# Patient Record
Sex: Male | Born: 1940 | Race: White | Hispanic: No | Marital: Single
Health system: Southern US, Community
[De-identification: ages and names within clinical notes are randomized; demographics above are authoritative.]

---

## 2011-11-25 ENCOUNTER — Inpatient Hospital Stay: Payer: Self-pay | Admitting: Specialist

## 2011-11-25 DIAGNOSIS — I369 Nonrheumatic tricuspid valve disorder, unspecified: Secondary | ICD-10-CM

## 2011-11-25 LAB — BASIC METABOLIC PANEL
Anion Gap: 11 (ref 7–16)
BUN: 51 mg/dL — ABNORMAL HIGH (ref 7–18)
Calcium, Total: 8.5 mg/dL (ref 8.5–10.1)
Chloride: 95 mmol/L — ABNORMAL LOW (ref 98–107)
Glucose: 153 mg/dL — ABNORMAL HIGH (ref 65–99)
Glucose: 196 mg/dL — ABNORMAL HIGH (ref 65–99)
Osmolality: 295 (ref 275–301)
Potassium: 5.9 mmol/L — ABNORMAL HIGH (ref 3.5–5.1)
Sodium: 133 mmol/L — ABNORMAL LOW (ref 136–145)

## 2011-11-25 LAB — URINALYSIS, COMPLETE
Hyaline Cast: 3
Nitrite: NEGATIVE
Protein: NEGATIVE
Specific Gravity: 1.013 (ref 1.003–1.030)
WBC UR: 2 /HPF (ref 0–5)

## 2011-11-25 LAB — CBC
HGB: 14.8 g/dL (ref 13.0–18.0)
MCH: 31 pg (ref 26.0–34.0)
MCHC: 31.9 g/dL — ABNORMAL LOW (ref 32.0–36.0)
Platelet: 263 10*3/uL (ref 150–440)
RBC: 4.76 10*6/uL (ref 4.40–5.90)

## 2011-11-25 LAB — COMPREHENSIVE METABOLIC PANEL
Albumin: 3.2 g/dL — ABNORMAL LOW (ref 3.4–5.0)
BUN: 60 mg/dL — ABNORMAL HIGH (ref 7–18)
Glucose: 186 mg/dL — ABNORMAL HIGH (ref 65–99)
SGOT(AST): 25 U/L (ref 15–37)
SGPT (ALT): 18 U/L
Total Protein: 7 g/dL (ref 6.4–8.2)

## 2011-11-25 LAB — CK TOTAL AND CKMB (NOT AT ARMC)
CK, Total: 97 U/L (ref 35–232)
CK-MB: 9.9 ng/mL — ABNORMAL HIGH (ref 0.5–3.6)
CK-MB: 10.8 ng/mL — ABNORMAL HIGH (ref 0.5–3.6)

## 2011-11-25 LAB — RAPID INFLUENZA A&B ANTIGENS

## 2011-11-25 LAB — TROPONIN I: Troponin-I: 0.06 ng/mL — ABNORMAL HIGH

## 2011-11-25 LAB — SODIUM, URINE, RANDOM: Sodium, Urine Random: 72 mmol/L (ref 20–110)

## 2011-11-26 LAB — PROTEIN ELECTROPHORESIS(ARMC)

## 2011-11-26 LAB — HEMOGLOBIN A1C: Hemoglobin A1C: 7.5 % — ABNORMAL HIGH (ref 4.2–6.3)

## 2011-11-26 LAB — BASIC METABOLIC PANEL
BUN: 42 mg/dL — ABNORMAL HIGH (ref 7–18)
Calcium, Total: 8.2 mg/dL — ABNORMAL LOW (ref 8.5–10.1)
Creatinine: 1.14 mg/dL (ref 0.60–1.30)
EGFR (Non-African Amer.): >60
Glucose: 199 mg/dL — ABNORMAL HIGH (ref 65–99)
Potassium: 5.8 mmol/L — ABNORMAL HIGH (ref 3.5–5.1)
Sodium: 137 mmol/L (ref 136–145)

## 2011-11-26 LAB — CBC WITH DIFFERENTIAL/PLATELET
Basophil #: 0 10*3/uL (ref 0.0–0.1)
Lymphocyte #: 0.2 10*3/uL — ABNORMAL LOW (ref 1.0–3.6)
MCH: 30.9 pg (ref 26.0–34.0)
MCHC: 32.3 g/dL (ref 32.0–36.0)
MCV: 96 fL (ref 80–100)
Monocyte #: 0.1 10*3/uL (ref 0.0–0.7)
Platelet: 229 10*3/uL (ref 150–440)
RDW: 13.9 % (ref 11.5–14.5)

## 2011-11-26 LAB — LIPID PANEL: Ldl Cholesterol, Calc: 52 mg/dL (ref 0–100)

## 2011-11-26 LAB — MAGNESIUM: Magnesium: 1.3 mg/dL — ABNORMAL LOW

## 2011-11-26 LAB — TROPONIN I: Troponin-I: 0.09 ng/mL — ABNORMAL HIGH

## 2011-11-27 LAB — CBC WITH DIFFERENTIAL/PLATELET
Eosinophil #: 0 10*3/uL (ref 0.0–0.7)
Lymphocyte #: 0.1 10*3/uL — ABNORMAL LOW (ref 1.0–3.6)
Lymphocyte %: 1.4 %
MCHC: 32.6 g/dL (ref 32.0–36.0)
MCV: 95 fL (ref 80–100)
Monocyte %: 2.9 %
Neutrophil #: 9.9 10*3/uL — ABNORMAL HIGH (ref 1.4–6.5)
Platelet: 230 10*3/uL (ref 150–440)
RDW: 14.2 % (ref 11.5–14.5)
WBC: 10.3 10*3/uL (ref 3.8–10.6)

## 2011-11-27 LAB — BASIC METABOLIC PANEL
Anion Gap: 10 (ref 7–16)
EGFR (African American): >60
EGFR (Non-African Amer.): >60
Glucose: 349 mg/dL — ABNORMAL HIGH (ref 65–99)
Potassium: 4.3 mmol/L (ref 3.5–5.1)

## 2011-11-27 LAB — MAGNESIUM
Magnesium: 1.9 mg/dL
Magnesium: 1.8 mg/dL

## 2011-11-27 LAB — UR PROT ELECTROPHORESIS, URINE RANDOM

## 2011-11-28 LAB — BASIC METABOLIC PANEL
Anion Gap: 10 (ref 7–16)
Calcium, Total: 8.4 mg/dL — ABNORMAL LOW (ref 8.5–10.1)
Chloride: 100 mmol/L (ref 98–107)
Co2: 32 mmol/L (ref 21–32)
Osmolality: 297 (ref 275–301)
Potassium: 4 mmol/L (ref 3.5–5.1)

## 2011-11-28 LAB — CBC WITH DIFFERENTIAL/PLATELET
Eosinophil %: 0 %
HGB: 13.2 g/dL (ref 13.0–18.0)
Lymphocyte %: 1.1 %
MCH: 31.1 pg (ref 26.0–34.0)
MCV: 96 fL (ref 80–100)
Monocyte #: 0.3 10*3/uL (ref 0.0–0.7)
Monocyte %: 1.8 %
Neutrophil %: 97.1 %
Platelet: 249 10*3/uL (ref 150–440)
RBC: 4.25 10*6/uL — ABNORMAL LOW (ref 4.40–5.90)

## 2011-11-28 LAB — T4, FREE: Free Thyroxine: 0.8 ng/dL (ref 0.76–1.46)

## 2011-11-29 LAB — BASIC METABOLIC PANEL
Anion Gap: 10 (ref 7–16)
Chloride: 100 mmol/L (ref 98–107)
Co2: 33 mmol/L — ABNORMAL HIGH (ref 21–32)
Creatinine: 0.9 mg/dL (ref 0.60–1.30)
Glucose: 141 mg/dL — ABNORMAL HIGH (ref 65–99)
Osmolality: 295 (ref 275–301)
Potassium: 4.4 mmol/L (ref 3.5–5.1)
Sodium: 143 mmol/L (ref 136–145)

## 2011-11-29 LAB — CBC WITH DIFFERENTIAL/PLATELET
Basophil %: 0 %
Eosinophil %: 0 %
HCT: 40.1 % (ref 40.0–52.0)
HGB: 13 g/dL (ref 13.0–18.0)
MCH: 31.1 pg (ref 26.0–34.0)
MCV: 96 fL (ref 80–100)
Monocyte #: 0.5 10*3/uL (ref 0.0–0.7)
Neutrophil %: 96.3 %
RBC: 4.19 10*6/uL — ABNORMAL LOW (ref 4.40–5.90)

## 2011-11-30 LAB — BASIC METABOLIC PANEL
Anion Gap: 8 (ref 7–16)
Calcium, Total: 8.4 mg/dL — ABNORMAL LOW (ref 8.5–10.1)
EGFR (African American): >60
Osmolality: 290 (ref 275–301)
Potassium: 4.6 mmol/L (ref 3.5–5.1)

## 2011-11-30 LAB — CBC WITH DIFFERENTIAL/PLATELET
Basophil #: 0 10*3/uL (ref 0.0–0.1)
Eosinophil #: 0 10*3/uL (ref 0.0–0.7)
HCT: 41.3 % (ref 40.0–52.0)
Lymphocyte #: 0.1 10*3/uL — ABNORMAL LOW (ref 1.0–3.6)
MCHC: 32.3 g/dL (ref 32.0–36.0)
MCV: 95 fL (ref 80–100)
Monocyte #: 0.3 10*3/uL (ref 0.0–0.7)
Neutrophil #: 14.4 10*3/uL — ABNORMAL HIGH (ref 1.4–6.5)
Platelet: 231 10*3/uL (ref 150–440)
RDW: 14.9 % — ABNORMAL HIGH (ref 11.5–14.5)

## 2011-11-30 LAB — CULTURE, BLOOD (SINGLE)

## 2011-12-03 LAB — BASIC METABOLIC PANEL
BUN: 47 mg/dL — ABNORMAL HIGH (ref 7–18)
Chloride: 97 mmol/L — ABNORMAL LOW (ref 98–107)
EGFR (Non-African Amer.): >60
Glucose: 107 mg/dL — ABNORMAL HIGH (ref 65–99)
Osmolality: 288 (ref 275–301)
Potassium: 5 mmol/L (ref 3.5–5.1)
Sodium: 138 mmol/L (ref 136–145)

## 2011-12-03 LAB — CBC WITH DIFFERENTIAL/PLATELET
Basophil #: 0 10*3/uL (ref 0.0–0.1)
Basophil %: 0 %
Eosinophil #: 0 10*3/uL (ref 0.0–0.7)
HCT: 39.9 % — ABNORMAL LOW (ref 40.0–52.0)
HGB: 13 g/dL (ref 13.0–18.0)
Lymphocyte #: 0.1 10*3/uL — ABNORMAL LOW (ref 1.0–3.6)
Lymphocyte %: 0.7 %
MCH: 31.1 pg (ref 26.0–34.0)
MCHC: 32.5 g/dL (ref 32.0–36.0)
MCV: 96 fL (ref 80–100)
Monocyte #: 0.4 10*3/uL (ref 0.0–0.7)
Neutrophil #: 13.1 10*3/uL — ABNORMAL HIGH (ref 1.4–6.5)
RDW: 14 % (ref 11.5–14.5)

## 2011-12-03 LAB — PHOSPHORUS: Phosphorus: 4.1 mg/dL (ref 2.5–4.9)

## 2011-12-04 LAB — CBC WITH DIFFERENTIAL/PLATELET
Basophil #: 0 10*3/uL (ref 0.0–0.1)
Eosinophil #: 0 10*3/uL (ref 0.0–0.7)
Eosinophil %: 0 %
HCT: 39 % — ABNORMAL LOW (ref 40.0–52.0)
HGB: 12.5 g/dL — ABNORMAL LOW (ref 13.0–18.0)
Lymphocyte #: 0.3 10*3/uL — ABNORMAL LOW (ref 1.0–3.6)
Lymphocyte %: 2.5 %
MCH: 30.5 pg (ref 26.0–34.0)
MCV: 95 fL (ref 80–100)
Monocyte %: 5.3 %
Neutrophil %: 92.2 %
Platelet: 203 10*3/uL (ref 150–440)
RBC: 4.1 10*6/uL — ABNORMAL LOW (ref 4.40–5.90)
RDW: 14.2 % (ref 11.5–14.5)

## 2011-12-04 LAB — BASIC METABOLIC PANEL
Anion Gap: 9 (ref 7–16)
Calcium, Total: 8.3 mg/dL — ABNORMAL LOW (ref 8.5–10.1)
Chloride: 97 mmol/L — ABNORMAL LOW (ref 98–107)
Co2: 32 mmol/L (ref 21–32)
Creatinine: 0.72 mg/dL (ref 0.60–1.30)
Potassium: 5 mmol/L (ref 3.5–5.1)
Sodium: 138 mmol/L (ref 136–145)

## 2011-12-08 LAB — BASIC METABOLIC PANEL
BUN: 33 mg/dL — ABNORMAL HIGH (ref 7–18)
Calcium, Total: 8.1 mg/dL — ABNORMAL LOW (ref 8.5–10.1)
Chloride: 95 mmol/L — ABNORMAL LOW (ref 98–107)
Creatinine: 0.74 mg/dL (ref 0.60–1.30)
EGFR (African American): >60
EGFR (Non-African Amer.): >60
Glucose: 297 mg/dL — ABNORMAL HIGH (ref 65–99)
Sodium: 135 mmol/L — ABNORMAL LOW (ref 136–145)

## 2011-12-08 LAB — CBC WITH DIFFERENTIAL/PLATELET
Basophil #: 0 10*3/uL (ref 0.0–0.1)
Basophil %: 0 %
Eosinophil #: 0 10*3/uL (ref 0.0–0.7)
HCT: 45.2 % (ref 40.0–52.0)
Lymphocyte %: 0.9 %
MCH: 30.9 pg (ref 26.0–34.0)
MCHC: 32.3 g/dL (ref 32.0–36.0)
Monocyte #: 0.6 10*3/uL (ref 0.0–0.7)
Monocyte %: 2.1 %
Neutrophil #: 26 10*3/uL — ABNORMAL HIGH (ref 1.4–6.5)
Neutrophil %: 97 %
Platelet: 201 10*3/uL (ref 150–440)
RDW: 14.4 % (ref 11.5–14.5)

## 2011-12-08 LAB — TROPONIN I: Troponin-I: 0.04 ng/mL

## 2011-12-08 LAB — CK TOTAL AND CKMB (NOT AT ARMC): CK-MB: 2.1 ng/mL (ref 0.5–3.6)

## 2011-12-09 LAB — CBC WITH DIFFERENTIAL/PLATELET
Basophil #: 0 10*3/uL (ref 0.0–0.1)
Basophil %: 0 %
Eosinophil #: 0 10*3/uL (ref 0.0–0.7)
Eosinophil %: 0 %
HCT: 43 % (ref 40.0–52.0)
HGB: 13.9 g/dL (ref 13.0–18.0)
Lymphocyte #: 0.1 10*3/uL — ABNORMAL LOW (ref 1.0–3.6)
Lymphocyte %: 0.4 %
MCV: 95 fL (ref 80–100)
Monocyte #: 0.2 10*3/uL (ref 0.0–0.7)
Monocyte %: 0.9 %
Neutrophil #: 20.9 10*3/uL — ABNORMAL HIGH (ref 1.4–6.5)
Neutrophil %: 98.7 %
Platelet: 178 10*3/uL (ref 150–440)
RBC: 4.5 10*6/uL (ref 4.40–5.90)
RDW: 13.9 % (ref 11.5–14.5)
WBC: 21.2 10*3/uL — ABNORMAL HIGH (ref 3.8–10.6)

## 2011-12-09 LAB — BASIC METABOLIC PANEL
Calcium, Total: 8.4 mg/dL — ABNORMAL LOW (ref 8.5–10.1)
Chloride: 97 mmol/L — ABNORMAL LOW (ref 98–107)
Co2: 32 mmol/L (ref 21–32)
Creatinine: 0.66 mg/dL (ref 0.60–1.30)
EGFR (Non-African Amer.): >60
Glucose: 276 mg/dL — ABNORMAL HIGH (ref 65–99)
Potassium: 4.6 mmol/L (ref 3.5–5.1)
Sodium: 139 mmol/L (ref 136–145)

## 2011-12-09 LAB — CLOSTRIDIUM DIFFICILE BY PCR

## 2011-12-10 LAB — CBC WITH DIFFERENTIAL/PLATELET
Basophil #: 0 10*3/uL (ref 0.0–0.1)
Basophil %: 0 %
Eosinophil %: 0 %
HCT: 40.5 % (ref 40.0–52.0)
HGB: 13.1 g/dL (ref 13.0–18.0)
Lymphocyte #: 0.1 10*3/uL — ABNORMAL LOW (ref 1.0–3.6)
Lymphocyte %: 0.9 %
Monocyte %: 2 %
Neutrophil #: 16.5 10*3/uL — ABNORMAL HIGH (ref 1.4–6.5)
RBC: 4.24 10*6/uL — ABNORMAL LOW (ref 4.40–5.90)
RDW: 13.7 % (ref 11.5–14.5)
WBC: 17 10*3/uL — ABNORMAL HIGH (ref 3.8–10.6)

## 2011-12-10 LAB — URINE CULTURE

## 2011-12-10 LAB — BASIC METABOLIC PANEL
Anion Gap: 10 (ref 7–16)
Calcium, Total: 8.4 mg/dL — ABNORMAL LOW (ref 8.5–10.1)
Chloride: 96 mmol/L — ABNORMAL LOW (ref 98–107)
Co2: 33 mmol/L — ABNORMAL HIGH (ref 21–32)
Creatinine: 0.6 mg/dL (ref 0.60–1.30)
EGFR (African American): >60

## 2011-12-11 LAB — BASIC METABOLIC PANEL
Anion Gap: 12 (ref 7–16)
Calcium, Total: 8.1 mg/dL — ABNORMAL LOW (ref 8.5–10.1)
Co2: 32 mmol/L (ref 21–32)
EGFR (African American): >60
EGFR (Non-African Amer.): >60
Glucose: 201 mg/dL — ABNORMAL HIGH (ref 65–99)
Osmolality: 293 (ref 275–301)
Sodium: 139 mmol/L (ref 136–145)

## 2011-12-11 LAB — CBC WITH DIFFERENTIAL/PLATELET
Basophil #: 0 10*3/uL (ref 0.0–0.1)
Lymphocyte #: 0.2 10*3/uL — ABNORMAL LOW (ref 1.0–3.6)
Lymphocyte %: 2.2 %
MCV: 95 fL (ref 80–100)
Monocyte #: 0.3 10*3/uL (ref 0.0–0.7)
Monocyte %: 2.4 %
Neutrophil #: 10.4 10*3/uL — ABNORMAL HIGH (ref 1.4–6.5)
Platelet: 143 10*3/uL — ABNORMAL LOW (ref 150–440)
RDW: 13.9 % (ref 11.5–14.5)
WBC: 10.9 10*3/uL — ABNORMAL HIGH (ref 3.8–10.6)

## 2011-12-12 LAB — CBC WITH DIFFERENTIAL/PLATELET
Basophil #: 0.1 10*3/uL (ref 0.0–0.1)
HGB: 12.3 g/dL — ABNORMAL LOW (ref 13.0–18.0)
Lymphocyte %: 4.5 %
MCH: 30.7 pg (ref 26.0–34.0)
MCHC: 32.7 g/dL (ref 32.0–36.0)
Monocyte %: 3.2 %
Neutrophil #: 9.9 10*3/uL — ABNORMAL HIGH (ref 1.4–6.5)
Neutrophil %: 91.6 %
RBC: 4.01 10*6/uL — ABNORMAL LOW (ref 4.40–5.90)
RDW: 14.1 % (ref 11.5–14.5)

## 2011-12-12 LAB — BASIC METABOLIC PANEL
Anion Gap: 10 (ref 7–16)
EGFR (African American): >60
Osmolality: 284 (ref 275–301)
Potassium: 3.9 mmol/L (ref 3.5–5.1)

## 2011-12-13 LAB — BASIC METABOLIC PANEL
Anion Gap: 12 (ref 7–16)
BUN: 32 mg/dL — ABNORMAL HIGH (ref 7–18)
Calcium, Total: 7.9 mg/dL — ABNORMAL LOW (ref 8.5–10.1)
Creatinine: 0.58 mg/dL — ABNORMAL LOW (ref 0.60–1.30)
EGFR (African American): >60
EGFR (Non-African Amer.): >60
Glucose: 101 mg/dL — ABNORMAL HIGH (ref 65–99)
Potassium: 3.9 mmol/L (ref 3.5–5.1)
Sodium: 140 mmol/L (ref 136–145)

## 2011-12-13 LAB — CBC WITH DIFFERENTIAL/PLATELET
Basophil #: 0 10*3/uL (ref 0.0–0.1)
Eosinophil #: 0 10*3/uL (ref 0.0–0.7)
HCT: 37.1 % — ABNORMAL LOW (ref 40.0–52.0)
Lymphocyte #: 0.4 10*3/uL — ABNORMAL LOW (ref 1.0–3.6)
MCHC: 32.8 g/dL (ref 32.0–36.0)
MCV: 94 fL (ref 80–100)
Monocyte #: 0.3 10*3/uL (ref 0.0–0.7)
Monocyte %: 3.1 %
Neutrophil #: 8.5 10*3/uL — ABNORMAL HIGH (ref 1.4–6.5)
Neutrophil %: 92.6 %
Platelet: 113 10*3/uL — ABNORMAL LOW (ref 150–440)
RBC: 3.96 10*6/uL — ABNORMAL LOW (ref 4.40–5.90)
RDW: 14 % (ref 11.5–14.5)
WBC: 9.2 10*3/uL (ref 3.8–10.6)

## 2011-12-14 LAB — CBC WITH DIFFERENTIAL/PLATELET
Basophil #: 0 10*3/uL (ref 0.0–0.1)
HCT: 34.3 % — ABNORMAL LOW (ref 40.0–52.0)
HGB: 11.3 g/dL — ABNORMAL LOW (ref 13.0–18.0)
Lymphocyte %: 4.2 %
MCH: 30.8 pg (ref 26.0–34.0)
Monocyte %: 3.2 %
Neutrophil #: 9.4 10*3/uL — ABNORMAL HIGH (ref 1.4–6.5)
RDW: 14.3 % (ref 11.5–14.5)
WBC: 10.2 10*3/uL (ref 3.8–10.6)

## 2011-12-14 LAB — BASIC METABOLIC PANEL
Anion Gap: 10 (ref 7–16)
BUN: 27 mg/dL — ABNORMAL HIGH (ref 7–18)
Creatinine: 0.61 mg/dL (ref 0.60–1.30)
EGFR (African American): >60
EGFR (Non-African Amer.): >60
Glucose: 148 mg/dL — ABNORMAL HIGH (ref 65–99)
Sodium: 137 mmol/L (ref 136–145)

## 2011-12-16 LAB — BASIC METABOLIC PANEL
Anion Gap: 8 (ref 7–16)
BUN: 17 mg/dL (ref 7–18)
Chloride: 94 mmol/L — ABNORMAL LOW (ref 98–107)
Creatinine: 0.51 mg/dL — ABNORMAL LOW (ref 0.60–1.30)
EGFR (African American): >60
Glucose: 112 mg/dL — ABNORMAL HIGH (ref 65–99)
Osmolality: 271 (ref 275–301)
Potassium: 4.1 mmol/L (ref 3.5–5.1)
Sodium: 134 mmol/L — ABNORMAL LOW (ref 136–145)

## 2011-12-16 LAB — CBC WITH DIFFERENTIAL/PLATELET
Basophil #: 0.1 10*3/uL (ref 0.0–0.1)
Eosinophil #: 0 10*3/uL (ref 0.0–0.7)
Eosinophil %: 0.4 %
HCT: 34 % — ABNORMAL LOW (ref 40.0–52.0)
HGB: 10.9 g/dL — ABNORMAL LOW (ref 13.0–18.0)
Lymphocyte #: 0.6 10*3/uL — ABNORMAL LOW (ref 1.0–3.6)
Lymphocyte %: 6.1 %
MCH: 30.3 pg (ref 26.0–34.0)
MCV: 95 fL (ref 80–100)
Monocyte #: 0.4 10*3/uL (ref 0.0–0.7)
Neutrophil #: 9.3 10*3/uL — ABNORMAL HIGH (ref 1.4–6.5)
Platelet: 125 10*3/uL — ABNORMAL LOW (ref 150–440)
RBC: 3.59 10*6/uL — ABNORMAL LOW (ref 4.40–5.90)
RDW: 14.1 % (ref 11.5–14.5)
WBC: 10.5 10*3/uL (ref 3.8–10.6)

## 2011-12-16 LAB — MAGNESIUM: Magnesium: 1.6 mg/dL — ABNORMAL LOW

## 2011-12-17 LAB — MAGNESIUM: Magnesium: 1.8 mg/dL

## 2011-12-20 LAB — CREATININE, SERUM
Creatinine: 0.44 mg/dL — ABNORMAL LOW (ref 0.60–1.30)
EGFR (African American): >60

## 2011-12-21 LAB — CBC WITH DIFFERENTIAL/PLATELET
Basophil #: 0 10*3/uL (ref 0.0–0.1)
Basophil %: 0.2 %
Eosinophil #: 0 10*3/uL (ref 0.0–0.7)
Eosinophil %: 0.3 %
HGB: 7.9 g/dL — ABNORMAL LOW (ref 13.0–18.0)
Lymphocyte %: 9.7 %
MCH: 30.9 pg (ref 26.0–34.0)
MCHC: 32.5 g/dL (ref 32.0–36.0)
MCV: 95 fL (ref 80–100)
Monocyte #: 0.4 10*3/uL (ref 0.0–0.7)
Neutrophil #: 5.3 10*3/uL (ref 1.4–6.5)
Neutrophil %: 82.8 %
Platelet: 210 10*3/uL (ref 150–440)
RBC: 2.57 10*6/uL — ABNORMAL LOW (ref 4.40–5.90)

## 2011-12-21 LAB — BASIC METABOLIC PANEL
Anion Gap: 5 — ABNORMAL LOW (ref 7–16)
BUN: 19 mg/dL — ABNORMAL HIGH (ref 7–18)
Calcium, Total: 8.7 mg/dL (ref 8.5–10.1)
Creatinine: 0.4 mg/dL — ABNORMAL LOW (ref 0.60–1.30)
EGFR (African American): >60
Glucose: 209 mg/dL — ABNORMAL HIGH (ref 65–99)
Osmolality: 275 (ref 275–301)
Sodium: 133 mmol/L — ABNORMAL LOW (ref 136–145)

## 2011-12-22 LAB — CBC WITH DIFFERENTIAL/PLATELET
Basophil #: 0 10*3/uL (ref 0.0–0.1)
Basophil %: 0.2 %
Eosinophil #: 0 10*3/uL (ref 0.0–0.7)
Eosinophil %: 0.7 %
HCT: 27.3 % — ABNORMAL LOW (ref 40.0–52.0)
HGB: 9.1 g/dL — ABNORMAL LOW (ref 13.0–18.0)
Lymphocyte %: 10.7 %
MCV: 93 fL (ref 80–100)
Monocyte #: 0.5 10*3/uL (ref 0.0–0.7)

## 2011-12-22 LAB — BASIC METABOLIC PANEL
Anion Gap: 7 (ref 7–16)
BUN: 17 mg/dL (ref 7–18)
Calcium, Total: 8.5 mg/dL (ref 8.5–10.1)
Chloride: 88 mmol/L — ABNORMAL LOW (ref 98–107)
Co2: 40 mmol/L (ref 21–32)
Creatinine: 0.41 mg/dL — ABNORMAL LOW (ref 0.60–1.30)
EGFR (African American): >60
EGFR (Non-African Amer.): >60
Glucose: 181 mg/dL — ABNORMAL HIGH (ref 65–99)
Potassium: 4.4 mmol/L (ref 3.5–5.1)
Sodium: 135 mmol/L — ABNORMAL LOW (ref 136–145)

## 2011-12-23 LAB — CBC WITH DIFFERENTIAL/PLATELET
Basophil #: 0 10*3/uL (ref 0.0–0.1)
Basophil %: 0.3 %
Eosinophil #: 0.1 10*3/uL (ref 0.0–0.7)
HCT: 27.8 % — ABNORMAL LOW (ref 40.0–52.0)
HGB: 9.1 g/dL — ABNORMAL LOW (ref 13.0–18.0)
Lymphocyte %: 10.4 %
MCH: 30.2 pg (ref 26.0–34.0)
MCHC: 32.7 g/dL (ref 32.0–36.0)
MCV: 92 fL (ref 80–100)
Monocyte #: 0.7 10*3/uL (ref 0.0–0.7)
Neutrophil #: 7.2 10*3/uL — ABNORMAL HIGH (ref 1.4–6.5)
Platelet: 297 10*3/uL (ref 150–440)
RBC: 3.02 10*6/uL — ABNORMAL LOW (ref 4.40–5.90)

## 2011-12-24 LAB — CBC WITH DIFFERENTIAL/PLATELET
Basophil %: 0.2 %
Eosinophil #: 0 10*3/uL (ref 0.0–0.7)
Eosinophil %: 0.4 %
HCT: 27 % — ABNORMAL LOW (ref 40.0–52.0)
HGB: 8.7 g/dL — ABNORMAL LOW (ref 13.0–18.0)
Lymphocyte %: 11.9 %
MCH: 29.7 pg (ref 26.0–34.0)
MCHC: 32.4 g/dL (ref 32.0–36.0)
Monocyte #: 0.6 10*3/uL (ref 0.0–0.7)
Neutrophil #: 6.7 10*3/uL — ABNORMAL HIGH (ref 1.4–6.5)
Neutrophil %: 80.7 %
RBC: 2.94 10*6/uL — ABNORMAL LOW (ref 4.40–5.90)
RDW: 15.7 % — ABNORMAL HIGH (ref 11.5–14.5)
WBC: 8.3 10*3/uL (ref 3.8–10.6)

## 2011-12-24 LAB — BASIC METABOLIC PANEL
Anion Gap: 4 — ABNORMAL LOW (ref 7–16)
BUN: 18 mg/dL (ref 7–18)
EGFR (African American): >60
EGFR (Non-African Amer.): >60
Osmolality: 273 (ref 275–301)
Sodium: 134 mmol/L — ABNORMAL LOW (ref 136–145)

## 2011-12-26 ENCOUNTER — Inpatient Hospital Stay
Admission: AD | Admit: 2011-12-26 | Discharge: 2012-03-18 | Disposition: A | Discharge status: Skilled Nursing Facility | Payer: Federal, State, Local not specified - PPO | Source: Ambulatory Visit | Attending: Internal Medicine | Admitting: Internal Medicine

## 2011-12-26 ENCOUNTER — Other Ambulatory Visit (HOSPITAL_COMMUNITY): Payer: Self-pay

## 2011-12-26 ENCOUNTER — Ambulatory Visit (HOSPITAL_COMMUNITY)
Admission: AD | Admit: 2011-12-26 | Discharge: 2011-12-26 | Disposition: A | Discharge status: Home / Self Care | Payer: Federal, State, Local not specified - PPO | Source: Other Acute Inpatient Hospital | Attending: Internal Medicine | Admitting: Internal Medicine

## 2011-12-26 DIAGNOSIS — Z930 Tracheostomy status: Secondary | ICD-10-CM

## 2011-12-26 DIAGNOSIS — J9600 Acute respiratory failure, unspecified whether with hypoxia or hypercapnia: Secondary | ICD-10-CM | POA: Insufficient documentation

## 2011-12-26 DIAGNOSIS — R0902 Hypoxemia: Secondary | ICD-10-CM

## 2011-12-26 DIAGNOSIS — J449 Chronic obstructive pulmonary disease, unspecified: Secondary | ICD-10-CM

## 2011-12-26 DIAGNOSIS — R932 Abnormal findings on diagnostic imaging of liver and biliary tract: Secondary | ICD-10-CM

## 2011-12-26 DIAGNOSIS — J9620 Acute and chronic respiratory failure, unspecified whether with hypoxia or hypercapnia: Secondary | ICD-10-CM

## 2011-12-26 DIAGNOSIS — J939 Pneumothorax, unspecified: Secondary | ICD-10-CM

## 2011-12-26 DIAGNOSIS — J189 Pneumonia, unspecified organism: Secondary | ICD-10-CM

## 2011-12-26 LAB — COMPREHENSIVE METABOLIC PANEL
ALT: 32 U/L (ref 0–53)
Albumin: 2 g/dL — ABNORMAL LOW (ref 3.5–5.2)
Alkaline Phosphatase: 121 U/L — ABNORMAL HIGH (ref 39–117)
Potassium: 4.4 mEq/L (ref 3.5–5.1)
Sodium: 134 mEq/L — ABNORMAL LOW (ref 135–145)
Total Protein: 6.5 g/dL (ref 6.0–8.3)

## 2011-12-26 LAB — CBC
HCT: 29.7 % — ABNORMAL LOW (ref 39.0–52.0)
MCH: 28.6 pg (ref 26.0–34.0)
MCHC: 31 g/dL (ref 30.0–36.0)
MCV: 92.2 fL (ref 78.0–100.0)
RDW: 15.6 % — ABNORMAL HIGH (ref 11.5–15.5)

## 2011-12-27 DIAGNOSIS — Z930 Tracheostomy status: Secondary | ICD-10-CM

## 2011-12-27 DIAGNOSIS — J9620 Acute and chronic respiratory failure, unspecified whether with hypoxia or hypercapnia: Secondary | ICD-10-CM

## 2011-12-27 DIAGNOSIS — J449 Chronic obstructive pulmonary disease, unspecified: Secondary | ICD-10-CM

## 2011-12-27 DIAGNOSIS — R0902 Hypoxemia: Secondary | ICD-10-CM

## 2011-12-27 DIAGNOSIS — J189 Pneumonia, unspecified organism: Secondary | ICD-10-CM

## 2011-12-27 NOTE — Consult Note (Addendum)
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  ADMIT/ CONSULT NOTE  History of Present Illness: 71 yo wm with hx of copd and tobacco abuse. He was tx for pna at [facility] and also suffered PEA arrest. He was at [facility] x 1 month. Transferred to [facility] 4/4 with tracheostomy, peg for rehabilitation and liberation from mechanical ventilatory support.   Lines / Drains: Trach>> Rt picc>> Peg>>  Cultures:   Antibiotics:   Tests / Events:   Subjective:  No past medical history on file. No past surgical history on file. Prior to Admission medications   Not on File   Allergies Allergies not on file  Family History No family history on file.  Social History  does not have a smoking history on file. He does not have any smokeless tobacco history on file. His alcohol and drug histories not on file.  Review Of Systems  11 points review of systems is negative with an exception of listed in HPI.  Vital Signs:    Vital signs reviewed. Abnormal values will appear under impression plan section.    Physical Examination: General:  Elderly wm  Neuro:  intact   HEENT:  trach Cardiovascular:  hsd Lungs:  Coarse rhonci Abdomen:  Peg with tf Musculoskeletal:  Lower ext with changes pv Skin:  As noted   Ventilator settings:  see orders  Labs and Imaging:  Dg Chest Port 1 View  12/26/2011  *RADIOLOGY REPORT*  Clinical Data: Hypoxia  PORTABLE CHEST - 1 VIEW  Comparison: None.  Findings:  Examination is degraded secondary to the exclusion of the left costophrenic angle.  Tracheostomy tube overlies tracheal air column.  Left upper extremity approach PICC line tip overlies the superior cavoatrial junction.  The lungs appear hyperinflated. Bibasilar  opacities, left greater than right.  No definite pleural effusion or pneumothorax.  No definite acute osseous abnormalities.  IMPRESSION: Hyperinflated lungs with bibasilar opacities, left greater than right, while possibly atelectasis, underlying infection is not excluded.  Original Report Authenticated By: Waynard Reeds, M.D.    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 -See vent, attempt to wean per protocol, no cannulation for now. - Agree with COPD treatment, continue bronchodilators.  DM agree with insulin.  Abx per noted, agree with choices.  Brett Canales Minor ACNP [facility] Pager [phone] till 3 pm If no  answer page [phone] 12/27/2011, 9:24 AM  Patient seen and examined, agree with above note.  I dictated the care and orders written for this patient under my direction.  Koren Bound, M.D. [phone]

## 2011-12-29 ENCOUNTER — Other Ambulatory Visit (HOSPITAL_COMMUNITY): Payer: Self-pay

## 2011-12-29 LAB — CBC
HCT: 27.9 % — ABNORMAL LOW (ref 39.0–52.0)
Hemoglobin: 8.3 g/dL — ABNORMAL LOW (ref 13.0–17.0)
MCH: 28.5 pg (ref 26.0–34.0)
MCHC: 29.7 g/dL — ABNORMAL LOW (ref 30.0–36.0)

## 2011-12-29 LAB — FERRITIN: Ferritin: 1006 ng/mL — ABNORMAL HIGH (ref 22–322)

## 2011-12-29 LAB — BASIC METABOLIC PANEL
BUN: 31 mg/dL — ABNORMAL HIGH (ref 6–23)
Chloride: 88 mEq/L — ABNORMAL LOW (ref 96–112)
GFR calc Af Amer: >90 mL/min (ref >90)
Glucose, Bld: 193 mg/dL — ABNORMAL HIGH (ref 70–99)
Potassium: 4.9 mEq/L (ref 3.5–5.1)

## 2011-12-29 LAB — IRON AND TIBC: TIBC: 204 ug/dL — ABNORMAL LOW (ref 215–435)

## 2011-12-30 ENCOUNTER — Other Ambulatory Visit (HOSPITAL_COMMUNITY): Payer: Self-pay

## 2011-12-30 DIAGNOSIS — J189 Pneumonia, unspecified organism: Secondary | ICD-10-CM

## 2011-12-30 DIAGNOSIS — Z930 Tracheostomy status: Secondary | ICD-10-CM

## 2011-12-30 DIAGNOSIS — J449 Chronic obstructive pulmonary disease, unspecified: Secondary | ICD-10-CM

## 2011-12-30 DIAGNOSIS — J9620 Acute and chronic respiratory failure, unspecified whether with hypoxia or hypercapnia: Secondary | ICD-10-CM

## 2011-12-30 DIAGNOSIS — R0902 Hypoxemia: Secondary | ICD-10-CM

## 2011-12-30 LAB — CBC
HCT: 28.8 % — ABNORMAL LOW (ref 39.0–52.0)
RDW: 16 % — ABNORMAL HIGH (ref 11.5–15.5)
WBC: 12.9 10*3/uL — ABNORMAL HIGH (ref 4.0–10.5)

## 2011-12-30 LAB — BASIC METABOLIC PANEL
BUN: 31 mg/dL — ABNORMAL HIGH (ref 6–23)
Calcium: 9.6 mg/dL (ref 8.4–10.5)
Creatinine, Ser: 0.31 mg/dL — ABNORMAL LOW (ref 0.50–1.35)
GFR calc non Af Amer: >90 mL/min (ref >90)
Glucose, Bld: 231 mg/dL — ABNORMAL HIGH (ref 70–99)
Sodium: 136 mEq/L (ref 135–145)

## 2011-12-30 NOTE — Progress Notes (Signed)
                                                                                                               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  [place] PCCM Progress Note  History of Present Illness: 71 yo wm with hx of copd and tobacco abuse. He was tx for pna at [facility] and also suffered PEA arrest. He was at [facility] x 1 month. Transferred to [facility] 4/4 with tracheostomy, peg for rehabilitation and liberation from mechanical ventilatory support.   Lines / Drains: Trach>> Rt picc>> Peg>>  Cultures:   Antibiotics:   Tests / Events:   Subjective:   Vital Signs:   97.7,97, 123/69, sats 99% (.4)   Physical Examination: General:  Elderly wm, neck slants to right Neuro:  intact   HEENT:  trach Cardiovascular:  hsd Lungs:  Coarse rhonci Abdomen:  Peg with tf Musculoskeletal:  Lower ext with changes pv Skin:  As noted   Ventilator settings:    Labs and Imaging:  PCXR: bibasilar atelectasis, improved when c/w prior film.   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 His debilitation and deconditioning will be his limiting factors to coming of vent. Failed attempt at PSV this am. plan - attempt to wean per protocol, no plans for  de-cannulation. - Agree with COPD treatment, continue bronchodilators-watch I/O closely escalate PS for  success  DM agree with insulin.  Abx per noted   Mcarthur Rossetti. Tyson Alias, MD, FACP Pgr: [phone] [place] [facility]

## 2011-12-31 LAB — BLOOD GAS, ARTERIAL
Acid-Base Excess: 12.1 mmol/L — ABNORMAL HIGH (ref 0.0–2.0)
Acid-Base Excess: 9.8 mmol/L — ABNORMAL HIGH (ref 0.0–2.0)
Bicarbonate: 35.9 mEq/L — ABNORMAL HIGH (ref 20.0–24.0)
Bicarbonate: 35.2 mEq/L — ABNORMAL HIGH (ref 20.0–24.0)
FIO2: 0.5 %
FIO2: 0.45 %
FIO2: 0.45 %
MECHVT: 500 mL
O2 Saturation: 97.2 %
O2 Saturation: 91.7 %
PEEP: 5 cmH2O
PEEP: 5 cmH2O
Patient temperature: 98.6
RATE: 12 resp/min
TCO2: 38.1 mmol/L (ref 0–100)
TCO2: 37.2 mmol/L (ref 0–100)
pCO2 arterial: 92.6 mmHg (ref 35.0–45.0)
pCO2 arterial: 71.6 mmHg (ref 35.0–45.0)
pH, Arterial: 7.253 — ABNORMAL LOW (ref 7.350–7.450)
pO2, Arterial: 88 mmHg (ref 80.0–100.0)
pO2, Arterial: 84.6 mmHg (ref 80.0–100.0)
pO2, Arterial: 57.4 mmHg — ABNORMAL LOW (ref 80.0–100.0)

## 2011-12-31 LAB — URINALYSIS, ROUTINE W REFLEX MICROSCOPIC
Glucose, UA: NEGATIVE mg/dL
Protein, ur: NEGATIVE mg/dL
pH: 7 (ref 5.0–8.0)

## 2011-12-31 LAB — URINE MICROSCOPIC-ADD ON

## 2011-12-31 LAB — BASIC METABOLIC PANEL
BUN: 35 mg/dL — ABNORMAL HIGH (ref 6–23)
CO2: 39 mEq/L — ABNORMAL HIGH (ref 19–32)
Chloride: 91 mEq/L — ABNORMAL LOW (ref 96–112)
Creatinine, Ser: 0.32 mg/dL — ABNORMAL LOW (ref 0.50–1.35)

## 2011-12-31 LAB — CBC
HCT: 29.6 % — ABNORMAL LOW (ref 39.0–52.0)
Hemoglobin: 8.5 g/dL — ABNORMAL LOW (ref 13.0–17.0)
MCV: 99.3 fL (ref 78.0–100.0)
RBC: 2.98 MIL/uL — ABNORMAL LOW (ref 4.22–5.81)
WBC: 16.5 10*3/uL — ABNORMAL HIGH (ref 4.0–10.5)

## 2011-12-31 NOTE — Progress Notes (Signed)
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  [place] PCCM Progress Note  History of Present Illness: 71 yo wm with hx of copd and tobacco abuse. He was tx for pna at [facility] and also suffered PEA arrest. He was at [facility] x 1 month. Transferred to [facility] 4/4 with tracheostomy, peg for rehabilitation and liberation from mechanical ventilatory support.   Lines / Drains: Trach>> Rt picc>> Peg>>  Cultures:   Antibiotics:   Tests / Events:   Subjective: Hypotensive, air trapping, requiring several fluid boluses.  acidotic  Vital Signs:   98.8, 83,90/55 (now up to systolic BP 115) 22, sats 100% .45    Physical Examination: General:  Elderly wm, neck slants to right Neuro:  intact   HEENT:  trach Cardiovascular:  hsd Lungs:  Prolonged exp wheeze. + air trapping, intrinsic PEEP 6-7 on RR of 22, down to less than 4 on rr 18 Current vent setting: 570/rr 18/peep 5 fio2 45%, Autopeep/and airtrapping less.  Abdomen:  Peg with tf Musculoskeletal:  Lower ext with changes pv Skin:  As noted     Labs and Imaging:  PCXR: bibasilar atelectasis, improved when c/w prior film.   Lab 12/31/11 0426 12/30/11 0410 12/29/11 0420  NA 136 136 136  K 4.0 4.2 4.9  CL 91* 93* 88*  CO2 39* 39* 44*  BUN 35* 31* 31*  CREATININE 0.32* 0.31* 0.33*  GLUCOSE 257* 231* 193*    Lab 12/31/11 0426 12/30/11 0410 12/29/11 0420  HGB 8.5* 8.5* 8.3*  HCT 29.6* 28.8* 27.9*  WBC 16.5* 12.9* 10.0  PLT 422* 391 353    ABG    Component Value Date/Time   PHART 7.321* 12/31/2011 1025   PCO2ART 71.6* 12/31/2011 1025   PO2ART 84.6 12/31/2011 1025   HCO3 35.9* 12/31/2011 1025   TCO2 38.1 12/31/2011 1025   O2SAT 97.2 12/31/2011 1025   Arterial Blood Gas result:  pO2 57;  pCO2 65; pH 7.35;  HCO3 35, %O2 Sat 91% after vent changes.    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 On 3/9 having increased bronchospasm and air trapping leading to worsening ventilator synchrony and auto-peep w/ resultant hypotension. Changes made to vent w/ significant improvement in intrinsic PEEP and resulted in decreased autopeep and hypotension,  plan - full vent support. Will now need to accept some degree of hypercarbia given bronchospasm -add systemic steroids -BD regimen adjusted -check sputum -check serial abg -may require bronch, follow peaks with treatment above bronchospasm No sig secretions Limit rate as above with copd abg to follow again , will review  Hypotension/shock. Now normalized w/ fluid and vent adjustment to correct autopeep.  Plan: -cont current vent settings -cont IVFs Reduce autopeep  DM agree with  insulin.  Abx per noted  Ccm 30 min    Mcarthur Rossetti. Tyson Alias, MD, FACP Pgr: [phone] [place] [facility]

## 2012-01-01 ENCOUNTER — Other Ambulatory Visit (HOSPITAL_COMMUNITY): Payer: Self-pay

## 2012-01-01 LAB — BLOOD GAS, ARTERIAL
Acid-Base Excess: 7.8 mmol/L — ABNORMAL HIGH (ref 0.0–2.0)
Bicarbonate: 32.7 mEq/L — ABNORMAL HIGH (ref 20.0–24.0)
FIO2: 0.45 %
TCO2: 34.4 mmol/L (ref 0–100)
pCO2 arterial: 54.4 mmHg — ABNORMAL HIGH (ref 35.0–45.0)
pH, Arterial: 7.397 (ref 7.350–7.450)
pO2, Arterial: 119 mmHg — ABNORMAL HIGH (ref 80.0–100.0)

## 2012-01-01 LAB — CBC
HCT: 26.1 % — ABNORMAL LOW (ref 39.0–52.0)
Hemoglobin: 7.8 g/dL — ABNORMAL LOW (ref 13.0–17.0)
MCV: 95.6 fL (ref 78.0–100.0)
WBC: 15.2 10*3/uL — ABNORMAL HIGH (ref 4.0–10.5)

## 2012-01-01 LAB — BASIC METABOLIC PANEL
CO2: 34 mEq/L — ABNORMAL HIGH (ref 19–32)
Chloride: 94 mEq/L — ABNORMAL LOW (ref 96–112)
GFR calc Af Amer: >90 mL/min (ref >90)
Potassium: 4.3 mEq/L (ref 3.5–5.1)

## 2012-01-01 NOTE — Progress Notes (Signed)
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  [place] PCCM Progress Note  History of Present Illness: 71 yo wm with hx of copd and tobacco abuse. He was tx for pna at [facility] and also suffered PEA arrest. He was at [facility] x 1 month. Transferred to [facility] 4/4 with tracheostomy, peg for rehabilitation and liberation from mechanical ventilatory support.   Lines / Drains: Trach>> Rt picc>> Peg>>  Cultures:  Sputum 4/9>>> Antibiotics:   Tests / Events: 4/9- bronchospasm, autopeep 4/10-improved  Subjective: Hypotensive, air trapping, requiring several fluid boluses.  acidotic  Vital Signs:    Temperature 97.4 heart rate 85 blood pressure 111/62 respirations 18-19 saturations 97% on FiO2 of 40.  Physical Examination: General:  Elderly wm, neck slants to right Neuro:  intact   HEENT:  trach Cardiovascular:  hsd Lungs:  Prolonged exp wheeze, improved.. + air trapping,still present, but improved as well. Current vent setting: 570/rr 18/peep 5 fio2 45%, Autopeep/and airtrapping less.  Abdomen:  Peg with tf Musculoskeletal:  Lower ext with changes pv Skin:  As noted     Labs and Imaging:  PCXR:  Clearing bibasilar aeration.   Lab 01/01/12 0355 12/31/11 0426 12/30/11 0410  NA 136 136 136  K 4.3 4.0 4.2  CL 94* 91* 93*  CO2 34* 39* 39*  BUN 56* 35* 31*  CREATININE 0.51 0.32* 0.31*  GLUCOSE 329* 257* 231*    Lab 01/01/12 0355 12/31/11 0426 12/30/11 0410  HGB 7.8* 8.5* 8.5*  HCT 26.1* 29.6* 28.8*  WBC 15.2* 16.5* 12.9*  PLT 478* 422* 391    ABG    Component Value Date/Time   PHART 7.397 01/01/2012 0558   PCO2ART 54.4* 01/01/2012 0558   PO2ART 119.0* 01/01/2012 0558   HCO3 32.7* 01/01/2012 0558   TCO2 34.4 01/01/2012 0558   O2SAT 98.5 01/01/2012 0558  .    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 On 3/9 having increased bronchospasm and air trapping leading to worsening ventilator synchrony and auto-peep w/ resultant hypotension. Changes made to vent w/ significant improvement in intrinsic PEEP and resulted in decreased autopeep and hypotension. His gas exchange and respiratory mechanics have improved in comparison to exam on 4-9. plan - Improved with yesterday changes -added systemic steroids, maintain this -BD regimen adjusted -followup sputum No sig secretions abg thi sam improved with same MV, improved pcxr In am, likely will attempt cpap /ps weaning Follow autopeep, seems improved , mostly from steroids  Hypotension/shock. Now normalized w/ fluid and vent adjustment to correct autopeep.  Plan: -cont current vent settings -cont IVFs Reduce autopeep  DM , exacerbated by steroids CBG (last 3)  Plan: Per #1 service  Leukocytosis, likely also steroid related.   Lab 01/01/12 0355  12/31/11 0426 12/30/11 0410  WBC 15.2* 16.5* 12.9*   Recommendation: -consider Procalcitonin algo  Mcarthur Rossetti. Tyson Alias, MD, FACP Pgr: [phone] [place] [facility]

## 2012-01-02 LAB — IRON AND TIBC
Saturation Ratios: 19 % — ABNORMAL LOW (ref 20–55)
UIBC: 175 ug/dL (ref 125–400)

## 2012-01-02 LAB — CBC
HCT: 24.7 % — ABNORMAL LOW (ref 39.0–52.0)
Hemoglobin: 7.5 g/dL — ABNORMAL LOW (ref 13.0–17.0)
MCV: 95.4 fL (ref 78.0–100.0)
Platelets: 469 10*3/uL — ABNORMAL HIGH (ref 150–400)
RBC: 2.59 MIL/uL — ABNORMAL LOW (ref 4.22–5.81)
WBC: 11.6 10*3/uL — ABNORMAL HIGH (ref 4.0–10.5)

## 2012-01-02 LAB — URINE CULTURE
Colony Count: >=100000
Culture  Setup Time: 201304091233

## 2012-01-02 LAB — FERRITIN: Ferritin: 622 ng/mL — ABNORMAL HIGH (ref 22–322)

## 2012-01-02 NOTE — Progress Notes (Signed)
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  [place] PCCM Progress Note  History of Present Illness: 71 yo wm with hx of copd and tobacco abuse. He was tx for pna at [facility] and also suffered PEA arrest. He was at [facility] x 1 month. Transferred to [facility] 4/4 with tracheostomy, peg for rehabilitation and liberation from mechanical ventilatory support.   Lines / Drains: Trach>> Rt picc>> Peg>>  Cultures:  Sputum 4/9: MODERATE PSEUDOMONAS AERUGINOSA sensitivities pending>>>  Antibiotics: Vancomycin 4/9>>> Zosyn 4/9>>>  Tests / Events: 4/9- bronchospasm, autopeep 4/10-improved  Subjective: Looks much better today  Vital Signs:   Afebrile, heart rate 95, blood pressure 122/60 respirations 13 saturation is 100% on FiO2 of 40.  Physical Examination: General:  Elderly wm, neck slants to right Neuro:  intact   HEENT:  trach Cardiovascular:  hsd Lungs:  Prolonged exp wheeze, improved.Marland Kitchen His work of breathing is remarkable today. He is tolerating pressure support of 8, obtaining up to 1 L tidal lines. He reports no dyspnea, I see no air trapping on spontaneous respiratory efforts. Abdomen:  Peg with tf Musculoskeletal:  Lower ext with changes pv Skin:  As noted     Labs and Imaging:  PCXR:  Clearing bibasilar aeration.   Lab 01/01/12 0355 12/31/11 0426 12/30/11 0410  NA 136 136 136  K 4.3 4.0 4.2  CL 94* 91* 93*  CO2 34* 39* 39*  BUN 56* 35* 31*  CREATININE 0.51 0.32* 0.31*  GLUCOSE 329* 257* 231*    Lab 01/02/12 0335 01/01/12 0355 12/31/11 0426  HGB 7.5* 7.8* 8.5*  HCT 24.7* 26.1* 29.6*  WBC 11.6* 15.2* 16.5*  PLT 469* 478* 422*    ABG    Component Value Date/Time   PHART 7.397 01/01/2012 0558   PCO2ART 54.4* 01/01/2012  0558   PO2ART 119.0* 01/01/2012 0558   HCO3 32.7* 01/01/2012 0558   TCO2 34.4 01/01/2012 0558   O2SAT 98.5 01/01/2012 0558  .    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 Complicated by Pseudomonas tracheobronchitis On 3/9 having increased bronchospasm and air trapping. His gas exchange and respiratory mechanics have improved re markedly, the following addition of antibiotics, and systemic steroids and escalation of his bronchodilator regimen. On 3/11 he is tolerating pressure support ventilation to the point that it appears he can initiate aerosol trach collar trials. plan -wean steroids slow -continue bronchodilators -followup sputum sensitivities -antibiotics per medicine team -Will trial aerosol trach collar today given how good he looks on pressure support. Also intermittent pressure support as tolerated, continue nocturnal rest on full support Reduced alb frequency   DM , exacerbated by steroids CBG (last 3)  Plan: Per #1 service  Anders Simmonds ACNP-BC [facility] Pulmonary/Critical Care Pager #  [phone] [place] # [phone] if no answer  I have fully examined this patient and agree with above findings.    And edited in full  Mcarthur Rossetti. Tyson Alias, Jeremy Vance, Jeremy Vance Pgr: [phone] [place] [facility]

## 2012-01-03 LAB — CBC
Hemoglobin: 7.9 g/dL — ABNORMAL LOW (ref 13.0–17.0)
MCHC: 30.5 g/dL (ref 30.0–36.0)
RDW: 17.1 % — ABNORMAL HIGH (ref 11.5–15.5)
WBC: 13.3 10*3/uL — ABNORMAL HIGH (ref 4.0–10.5)

## 2012-01-03 LAB — BASIC METABOLIC PANEL
BUN: 51 mg/dL — ABNORMAL HIGH (ref 6–23)
GFR calc Af Amer: >90 mL/min (ref >90)
GFR calc non Af Amer: >90 mL/min (ref >90)
Potassium: 3.5 mEq/L (ref 3.5–5.1)

## 2012-01-03 LAB — CULTURE, RESPIRATORY W GRAM STAIN

## 2012-01-03 NOTE — Progress Notes (Signed)
                                                                                                               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  [place] PCCM Progress Note  History of Present Illness: 71 yo wm with hx of copd and tobacco abuse. He was tx for pna at [facility] and also suffered PEA arrest. He was at [facility] x 1 month. Transferred to [facility] 4/4 with tracheostomy, peg for rehabilitation and liberation from mechanical ventilatory support.   Lines / Drains: Trach>> Rt picc>> Peg>>  Cultures:  Sputum 4/9: MODERATE PSEUDOMONAS AERUGINOSA (pansens)  Antibiotics:  Zosyn 4/9>>>  Tests / Events: 4/9- bronchospasm, autopeep 4/10-improved  Subjective: Looks much better today  Vital Signs:   98.8 130 118/78 rr 21 sats 97% (.4)  Physical Examination: General:  Elderly wm, neck slants to right Neuro:  intact   HEENT:  trach Cardiovascular:  hsd Lungs:  Prolonged exp wheeze, improved.Marland Kitchen His work of breathing is remarkable today. He is tolerating pressure support well Abdomen:  Peg with tf Musculoskeletal:  Lower ext with changes pv Skin:  As noted     Labs and Imaging:  PCXR:  Clearing bibasilar aeration.   Lab 01/03/12 0345 01/01/12 0355 12/31/11 0426  NA 146* 136 136  K 3.5 4.3 4.0  CL 104 94* 91*  CO2 35* 34* 39*  BUN 51* 56* 35*  CREATININE 0.44* 0.51 0.32*  GLUCOSE 131* 329* 257*    Lab 01/03/12 0345 01/02/12 0335 01/01/12 0355  HGB 7.9* 7.5* 7.8*  HCT 25.9* 24.7* 26.1*  WBC 13.3* 11.6* 15.2*  PLT 449* 469* 478*    ABG    Component Value Date/Time   PHART 7.397 01/01/2012 0558   PCO2ART 54.4* 01/01/2012 0558   PO2ART 119.0* 01/01/2012 0558   HCO3 32.7* 01/01/2012 0558   TCO2 34.4 01/01/2012 0558   O2SAT 98.5 01/01/2012 0558  .    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 Complicated by Pansens Pseudomonas tracheobronchitis On 3/9 having increased bronchospasm and air trapping. His gas exchange and respiratory mechanics have improved re markedly, the following addition of antibiotics, and systemic steroids and escalation of his bronchodilator regimen. Tolerating PSV well.  plan -wean steroids slow -continue bronchodilators -antibiotics per medicine team -cont wean protocol, ps attempt and trach collar  Hypernatremia Free water Chem in am   DM , exacerbated by steroids CBG (last 3)  Plan: Per #1 service  Anders Simmonds ACNP-BC [facility] Pulmonary/Critical Care Pager # [phone] OR # [phone] if no answer

## 2012-01-05 ENCOUNTER — Other Ambulatory Visit: Payer: Self-pay

## 2012-01-05 LAB — BASIC METABOLIC PANEL
BUN: 55 mg/dL — ABNORMAL HIGH (ref 6–23)
Chloride: 110 mEq/L (ref 96–112)
GFR calc Af Amer: >90 mL/min (ref >90)
Potassium: 3.5 mEq/L (ref 3.5–5.1)
Sodium: 155 mEq/L — ABNORMAL HIGH (ref 135–145)

## 2012-01-05 LAB — CBC
HCT: 29.3 % — ABNORMAL LOW (ref 39.0–52.0)
Hemoglobin: 8.4 g/dL — ABNORMAL LOW (ref 13.0–17.0)
RDW: 18.1 % — ABNORMAL HIGH (ref 11.5–15.5)
WBC: 10.6 10*3/uL — ABNORMAL HIGH (ref 4.0–10.5)

## 2012-01-06 LAB — CULTURE, BLOOD (ROUTINE X 2)
Culture  Setup Time: 201304092312
Culture  Setup Time: 201304092312
Culture: NO GROWTH

## 2012-01-06 LAB — BASIC METABOLIC PANEL
BUN: 61 mg/dL — ABNORMAL HIGH (ref 6–23)
Chloride: 110 mEq/L (ref 96–112)
GFR calc Af Amer: >90 mL/min (ref >90)
Potassium: 3.7 mEq/L (ref 3.5–5.1)
Sodium: 154 mEq/L — ABNORMAL HIGH (ref 135–145)

## 2012-01-06 NOTE — Progress Notes (Signed)
                                                                                                               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  [place] PCCM Progress Note  History of Present Illness: 71 yo wm with hx of copd and tobacco abuse. He was tx for pna at [facility] and also suffered PEA arrest. He was at [facility] x 1 month. Transferred to [facility] 4/4 with tracheostomy, peg for rehabilitation and liberation from mechanical ventilatory support.   Lines / Drains: Trach>> Rt picc>> Peg>>  Cultures:  Sputum 4/9: MODERATE PSEUDOMONAS AERUGINOSA (pansens)  Antibiotics: Per primary team    Subjective: Comfortable on 60% TC.   Vital Signs:   98.8 130 118/78 rr 21 sats 97% (.4)  Physical Examination: General:  Elderly wm, neck slants to right Neuro:  intact   HEENT:  trach Cardiovascular:  RRR s M Lungs:  No wheeze. 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 Complicated by Pansens Pseudomonas tracheobronchitis. plan -Cont wean steroids slow -continue bronchodilators -antibiotics per medicine team -cont wean protocol, ps attempt and trach collar   DM , exacerbated by steroids  Plan: Per #1 service     Billy Fischer, MD;  PCCM service; Mobile [phone]

## 2012-01-08 ENCOUNTER — Other Ambulatory Visit (HOSPITAL_COMMUNITY): Payer: Self-pay

## 2012-01-08 LAB — CBC
HCT: 30.9 % — ABNORMAL LOW (ref 39.0–52.0)
Hemoglobin: 9 g/dL — ABNORMAL LOW (ref 13.0–17.0)
MCHC: 29.1 g/dL — ABNORMAL LOW (ref 30.0–36.0)
RBC: 3 MIL/uL — ABNORMAL LOW (ref 4.22–5.81)
WBC: 12.8 10*3/uL — ABNORMAL HIGH (ref 4.0–10.5)

## 2012-01-08 LAB — BASIC METABOLIC PANEL
BUN: 48 mg/dL — ABNORMAL HIGH (ref 6–23)
CO2: 38 mEq/L — ABNORMAL HIGH (ref 19–32)
Chloride: 106 mEq/L (ref 96–112)
Glucose, Bld: 254 mg/dL — ABNORMAL HIGH (ref 70–99)
Potassium: 3.8 mEq/L (ref 3.5–5.1)
Sodium: 151 mEq/L — ABNORMAL HIGH (ref 135–145)

## 2012-01-09 LAB — BASIC METABOLIC PANEL
BUN: 42 mg/dL — ABNORMAL HIGH (ref 6–23)
CO2: 42 mEq/L (ref 19–32)
Chloride: 105 mEq/L (ref 96–112)
Creatinine, Ser: 0.36 mg/dL — ABNORMAL LOW (ref 0.50–1.35)
Potassium: 3.6 mEq/L (ref 3.5–5.1)

## 2012-01-10 LAB — BASIC METABOLIC PANEL
CO2: 41 mEq/L (ref 19–32)
Calcium: 8.5 mg/dL (ref 8.4–10.5)
Chloride: 102 mEq/L (ref 96–112)
Creatinine, Ser: 0.33 mg/dL — ABNORMAL LOW (ref 0.50–1.35)
Glucose, Bld: 90 mg/dL (ref 70–99)

## 2012-01-10 NOTE — Progress Notes (Signed)
                                                                                                               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  [place] PCCM Progress Note  History of Present Illness: 71 yo wm with hx of copd and tobacco abuse. He was tx for pna at [facility] and also suffered PEA arrest. He was at [facility] x 1 month. Transferred to [facility] 4/4 with tracheostomy, peg for rehabilitation and liberation from mechanical ventilatory support.   Lines / Drains: Trach>> Rt picc>> Peg>>  Cultures:  Sputum 4/9: MODERATE PSEUDOMONAS AERUGINOSA (pansens)  Antibiotics: Per primary team   Subjective: On PS 12 with Vt in 500s.   Vital Signs:   98.8 130 118/78 rr 21 sats 97% (.4)  Physical Examination: General:  Elderly wm, tolerates PSV Neuro:  intact   HEENT:  trach Cardiovascular:  RRR s M Lungs:  No wheeze. 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 Complicated by Pansens Pseudomonas tracheobronchitis.  -Cont wean steroids to off -continue bronchodilators -antibiotics per medicine team -cont wean protocol, ps attempt and trach collar   DM , exacerbated by steroids  Plan: Per #1 service    Billy Fischer, MD;  PCCM service; Mobile [phone]

## 2012-01-11 ENCOUNTER — Other Ambulatory Visit (HOSPITAL_COMMUNITY): Payer: Self-pay

## 2012-01-11 LAB — BASIC METABOLIC PANEL
BUN: 34 mg/dL — ABNORMAL HIGH (ref 6–23)
CO2: 42 mEq/L (ref 19–32)
Calcium: 8.8 mg/dL (ref 8.4–10.5)
GFR calc non Af Amer: >90 mL/min (ref >90)
Glucose, Bld: 78 mg/dL (ref 70–99)
Potassium: 4.1 mEq/L (ref 3.5–5.1)
Sodium: 143 mEq/L (ref 135–145)

## 2012-01-11 LAB — CBC
Hemoglobin: 8.2 g/dL — ABNORMAL LOW (ref 13.0–17.0)
MCH: 29.9 pg (ref 26.0–34.0)
MCHC: 29.7 g/dL — ABNORMAL LOW (ref 30.0–36.0)
MCV: 100.7 fL — ABNORMAL HIGH (ref 78.0–100.0)
RBC: 2.74 MIL/uL — ABNORMAL LOW (ref 4.22–5.81)

## 2012-01-11 LAB — VANCOMYCIN, TROUGH: Vancomycin Tr: 13.8 ug/mL (ref 10.0–20.0)

## 2012-01-12 LAB — CBC
HCT: 29.3 % — ABNORMAL LOW (ref 39.0–52.0)
MCH: 30.7 pg (ref 26.0–34.0)
MCHC: 29.7 g/dL — ABNORMAL LOW (ref 30.0–36.0)
MCV: 103.5 fL — ABNORMAL HIGH (ref 78.0–100.0)
Platelets: 309 10*3/uL (ref 150–400)
RDW: 19.6 % — ABNORMAL HIGH (ref 11.5–15.5)
WBC: 23.7 10*3/uL — ABNORMAL HIGH (ref 4.0–10.5)

## 2012-01-12 LAB — URINE CULTURE
Colony Count: 35000
Culture  Setup Time: 201304210450

## 2012-01-12 LAB — BASIC METABOLIC PANEL
BUN: 32 mg/dL — ABNORMAL HIGH (ref 6–23)
CO2: 41 mEq/L (ref 19–32)
Calcium: 8.9 mg/dL (ref 8.4–10.5)
Chloride: 98 mEq/L (ref 96–112)
Creatinine, Ser: 0.27 mg/dL — ABNORMAL LOW (ref 0.50–1.35)
Glucose, Bld: 83 mg/dL (ref 70–99)

## 2012-01-13 ENCOUNTER — Other Ambulatory Visit (HOSPITAL_COMMUNITY): Payer: Self-pay

## 2012-01-13 LAB — BASIC METABOLIC PANEL
CO2: 45 mEq/L (ref 19–32)
Calcium: 8.7 mg/dL (ref 8.4–10.5)
Creatinine, Ser: 0.29 mg/dL — ABNORMAL LOW (ref 0.50–1.35)
GFR calc non Af Amer: >90 mL/min (ref >90)
Glucose, Bld: 155 mg/dL — ABNORMAL HIGH (ref 70–99)

## 2012-01-13 LAB — CBC
MCH: 30.4 pg (ref 26.0–34.0)
MCHC: 29.9 g/dL — ABNORMAL LOW (ref 30.0–36.0)
MCV: 101.9 fL — ABNORMAL HIGH (ref 78.0–100.0)
Platelets: 266 10*3/uL (ref 150–400)
RBC: 2.63 MIL/uL — ABNORMAL LOW (ref 4.22–5.81)

## 2012-01-13 NOTE — Progress Notes (Signed)
Name: Jeremy Vance MRN: [id number] DOB: 02-Aug-1941    LOS: 18 Requesting MD:  Arelia Longest Regarding: Trach/vent dependent post pna and PEA arrest.  [place] PCCM Progress Note  History of Present Illness: 71 yo wm with hx of copd and tobacco abuse. He was tx for pna at [facility] and also suffered PEA arrest. He was at [facility] x 1 month. Transferred to [facility] 4/4 with tracheostomy, peg for rehabilitation and liberation from mechanical ventilatory support.   Lines / Drains: Trach>> Rt picc>> Peg>>  Cultures:  Sputum 4/9: MODERATE PSEUDOMONAS AERUGINOSA (pansens)  Antibiotics: Per primary team   Subjective: Awake and follows commands. On full vent support  Vital Signs:   Vital signs reviewed. Abnormal values will appear under impression plan section.    Physical Examination: General:  Elderly wm,  Neuro:  intact   HEENT:  trach Cardiovascular:  RRR s M Lungs:  No wheeze. Scattered rhonchi Abdomen:  Peg site clean Musculoskeletal: 1+ edema, chronic stasis changes Skin:  As noted     Labs and Imaging:    Lab 01/13/12 0500 01/12/12 0520 01/11/12 0530  NA 140 145 143  K 4.0 4.2 4.1  CL 93* 98 99  CO2 45* 41* 42*  BUN 31* 32* 34*  CREATININE 0.29* 0.27* 0.31*  GLUCOSE 155* 83 78    Lab 01/13/12 0500 01/12/12 0520 01/11/12 0530  HGB 8.0* 8.7* 8.2*  HCT 26.8* 29.3* 27.6*  WBC 15.6* 23.7* 23.6*  PLT 266 309 261    Dg Chest Port 1 View  01/13/2012  *RADIOLOGY REPORT*  Clinical Data: Ventilator dependence.  Hypoxia.  PORTABLE CHEST - 1 VIEW  Comparison: 01/11/2012  Findings: 0654 hours.  The bibasilar atelectasis/infiltrate persist, left greater than right, without substantial change.  Left PICC line tip projects at the proximal SVC level and appears to have been pulled back slightly in the interval.  Tracheostomy tube remains in  place.  Heart size is stable.  IMPRESSION: No substantial change in cardiopulmonary exam.  Right PICC line tip is more proximal in the SVC than on the prior study.  Original Report Authenticated By: ERIC A. MANSELL, M.D.      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 Complicated by Pansens Pseudomonas tracheobronchitis.  -Cont wean steroids to off, off 4/22 -continue bronchodilators -antibiotics per medicine team -cont wean protocol, ps attempted and failed rapidly In setting worsening LLL infiltrate, recent leukocytosis, new organisms? reoccureent pseudo PNA>/? Has pseudo 4/20 sputum await sens but high risk for res now to zosyn and worsening on therapy Dc vanc, we have likely pathogen pseudo on 4/20 Would follow pcxr in am  -re attempt PS 15 in am , in hopes for longer weaning duration  DM , exacerbated by steroids  Plan: Per #1 service    Brett Canales Minor ACNP [facility] Pager  [phone] till 3 pm If no answer page [phone] 01/13/2012, 9:46 AM   I have fully examined this patient and agree with above findings.    And edited in full   Mcarthur Rossetti. Tyson Alias, MD, FACP Pgr: [phone] [place] [facility]

## 2012-01-14 ENCOUNTER — Other Ambulatory Visit (HOSPITAL_COMMUNITY): Payer: Self-pay

## 2012-01-14 DIAGNOSIS — J151 Pneumonia due to Pseudomonas: Secondary | ICD-10-CM

## 2012-01-14 LAB — CBC
HCT: 28.8 % — ABNORMAL LOW (ref 39.0–52.0)
MCHC: 29.5 g/dL — ABNORMAL LOW (ref 30.0–36.0)
MCV: 100.7 fL — ABNORMAL HIGH (ref 78.0–100.0)
Platelets: 285 10*3/uL (ref 150–400)
RDW: 19.9 % — ABNORMAL HIGH (ref 11.5–15.5)
WBC: 16 10*3/uL — ABNORMAL HIGH (ref 4.0–10.5)

## 2012-01-14 LAB — CULTURE, RESPIRATORY W GRAM STAIN

## 2012-01-14 LAB — BASIC METABOLIC PANEL
BUN: 27 mg/dL — ABNORMAL HIGH (ref 6–23)
CO2: 39 mEq/L — ABNORMAL HIGH (ref 19–32)
Calcium: 9 mg/dL (ref 8.4–10.5)
Creatinine, Ser: 0.23 mg/dL — ABNORMAL LOW (ref 0.50–1.35)
GFR calc Af Amer: >90 mL/min (ref >90)

## 2012-01-14 NOTE — Consult Note (Signed)
Date of Admission:  12/26/2011  Date of Consult:  01/14/2012  Reason for Consult:worsening pneumonia with emerging R pseudomonas Aeruginosa Referring Physician: Dr. Campbell Riches  HPI: Jeremy Vance is an 71 y.o. male with hx of COPD, VDRF sp tracheostomy, PEG recently admitted to [facility] with pneumonia (MSSA and Amp S enteroccous) sp therapy with unasyn transferred [facility] on 12/26/11 with  worsenign clincal status hypoxia infiltrates, hypotension on 12/31/11 started on empiric vancomycin and zosyn with isolation of initially pan-sensitive Ps Aeruginosa but then worsening of pulmonary status and now isolation of more R pseudomonas Aeruginosa changed to imipenem and levaquin 500mg . CCM following closely and have recommended double coverage vs this species. We were consulted to assist in management of emerging MDR pseudomonas.   No past medical history on file. COPD VDRL PEA arrest MSSA and Enterococcus (amp S) PNA PEG tube SMoker  No past surgical history on file.ergies:  Tracheostomy, PEG  Allergies not on file   Medications: I have reviewed patients current medications as documented in Epic Anti-infectives    None    Vancomycin 4/10/--> 4/22 Zosyn  4/10--> 4/22 Imipnem 4/22--> Levaquin 4/23-->  Social History:  does not have a smoking history on file. He does not have any smokeless tobacco history on file. His alcohol and drug histories not on file.  No family history on file.  As in HPI and primary teams notes otherwise 12 point review of systems is negative  There were no vitals taken for this visit. General: Alert and awake, oriented  not in any acute distress. HEENT: anicteric sclera, pupils reactive to light and accommodation, EOMI, oropharynx clear and without exudate CVS tachycardic, no mgr Chest:course breath sounds Abdomen: soft nontender, nondistended, normal bowel sounds, Extremities: no  clubbing or edema noted bilaterally Skin: tracheostomy site is  clean with steri strips Neuro diffusely weak but: nonfocal,sensation intact   Results for orders placed during the hospital encounter of 12/26/11 (from the past 48 hour(s))  VANCOMYCIN, TROUGH     Status: Abnormal   Collection Time   01/13/12  5:00 AM      Component Value Range Comment   Vancomycin Tr 21.3 (*) 10.0 - 20.0 (ug/mL)   CBC     Status: Abnormal   Collection Time   01/13/12  5:00 AM      Component Value Range Comment   WBC 15.6 (*) 4.0 - 10.5 (K/uL)    RBC 2.63 (*) 4.22 - 5.81 (MIL/uL)    Hemoglobin 8.0 (*) 13.0 - 17.0 (g/dL)    HCT 40.9 (*) 81.1 - 52.0 (%)    MCV 101.9 (*) 78.0 - 100.0 (fL)    MCH 30.4  26.0 - 34.0 (pg)    MCHC 29.9 (*) 30.0 - 36.0 (g/dL)    RDW 91.4 (*) 78.2 - 15.5 (%)    Platelets 266  150 - 400 (K/uL)   BASIC METABOLIC PANEL     Status: Abnormal   Collection Time   01/13/12  5:00 AM      Component Value Range Comment   Sodium 140  135 - 145 (mEq/L)    Potassium 4.0  3.5 - 5.1 (mEq/L)    Chloride 93 (*) 96 - 112 (mEq/L)    CO2 45 (*) 19 - 32 (mEq/L)    Glucose, Bld 155 (*) 70 - 99 (mg/dL)    BUN 31 (*) 6 - 23 (mg/dL)    Creatinine, Ser 9.56 (*) 0.50 - 1.35 (mg/dL)    Calcium 8.7  8.4 - 10.5 (mg/dL)    GFR calc non Af Amer >90  >90 (mL/min)    GFR calc Af Amer >90  >90 (mL/min)   BASIC METABOLIC PANEL     Status: Abnormal   Collection Time   01/14/12  3:01 PM      Component Value Range Comment   Sodium 139  135 - 145 (mEq/L)    Potassium 4.6  3.5 - 5.1 (mEq/L)    Chloride 93 (*) 96 - 112 (mEq/L)    CO2 39 (*) 19 - 32 (mEq/L)    Glucose, Bld 89  70 - 99 (mg/dL)    BUN 27 (*) 6 - 23 (mg/dL)    Creatinine, Ser 0.86 (*) 0.50 - 1.35 (mg/dL)    Calcium 9.0  8.4 - 10.5 (mg/dL)    GFR calc non Af Amer >90  >90 (mL/min)    GFR calc Af Amer >90  >90 (mL/min)   CBC     Status: Abnormal   Collection Time   01/14/12  3:01 PM      Component Value Range Comment   WBC 16.0 (*) 4.0 - 10.5 (K/uL)    RBC 2.86 (*) 4.22 - 5.81 (MIL/uL)    Hemoglobin 8.5  (*) 13.0 - 17.0 (g/dL)    HCT 57.8 (*) 46.9 - 52.0 (%)    MCV 100.7 (*) 78.0 - 100.0 (fL)    MCH 29.7  26.0 - 34.0 (pg)    MCHC 29.5 (*) 30.0 - 36.0 (g/dL)    RDW 62.9 (*) 52.8 - 15.5 (%)    Platelets 285  150 - 400 (K/uL)       Component Value Date/Time   SDES URINE, CLEAN CATCH 01/11/2012 1848   SPECREQUEST NONE 01/11/2012 1848   CULT Multiple bacterial morphotypes present, none predominant. Suggest appropriate recollection if clinically indicated. 01/11/2012 1848   REPTSTATUS 01/12/2012 FINAL 01/11/2012 1848   Dg Chest Port 1 View  01/14/2012  *RADIOLOGY REPORT*  Clinical Data: Tracheostomy.  Pneumonia  PORTABLE CHEST - 1 VIEW  Comparison: 01/13/2012  Findings: Left lower lobe consolidation shows mild progression. This is probably pneumonia.  Milder right lower lobe consolidation is unchanged.  Tracheostomy is unchanged.  COPD is present. Left arm PICC tip is in the SVC, unchanged.  IMPRESSION: Worsening aeration in the left lower lobe compatible with pneumonia and collapse.  Probable left pleural effusion.  No change in mild right lower lobe consolidation.  Original Report Authenticated By: Camelia Phenes, M.D.   Dg Chest Port 1 View  01/13/2012  *RADIOLOGY REPORT*  Clinical Data: Ventilator dependence.  Hypoxia.  PORTABLE CHEST - 1 VIEW  Comparison: 01/11/2012  Findings: 0654 hours.  The bibasilar atelectasis/infiltrate persist, left greater than right, without substantial change.  Left PICC line tip projects at the proximal SVC level and appears to have been pulled back slightly in the interval.  Tracheostomy tube remains in place.  Heart size is stable.  IMPRESSION: No substantial change in cardiopulmonary exam.  Right PICC line tip is more proximal in the SVC than on the prior study.  Original Report Authenticated By: ERIC A. MANSELL, M.D.     Recent Results (from the past 720 hour(s))  CULTURE, BLOOD (ROUTINE X 2)     Status: Normal   Collection Time   12/31/11  9:20 AM      Component  Value Range Status Comment   Specimen Description BLOOD PICC LINE   Final    Special Requests BOTTLES DRAWN AEROBIC  AND ANAEROBIC 10CC   Final    Culture  Setup Time [id number]   Final    Culture NO GROWTH 5 DAYS   Final    Report Status 01/06/2012 FINAL   Final   CULTURE, BLOOD (ROUTINE X 2)     Status: Normal   Collection Time   12/31/11  9:40 AM      Component Value Range Status Comment   Specimen Description BLOOD HAND RIGHT   Final    Special Requests BOTTLES DRAWN AEROBIC ONLY 1.5CC   Final    Culture  Setup Time [id number]   Final    Culture NO GROWTH 5 DAYS   Final    Report Status 01/06/2012 FINAL   Final   URINE CULTURE     Status: Normal   Collection Time   12/31/11 11:50 AM      Component Value Range Status Comment   Specimen Description URINE, RANDOM   Final    Special Requests NONE   Final    Culture  Setup Time [id number]   Final    Colony Count >=100,000 COLONIES/ML   Final    Culture     Final    Value: PSEUDOMONAS AERUGINOSA     Note: Two isolates with different morphologies were identified as the same organism.The most resistant organism was reported.   Report Status 01/02/2012 FINAL   Final    Organism ID, Bacteria PSEUDOMONAS AERUGINOSA   Final   CULTURE, RESPIRATORY     Status: Normal   Collection Time   12/31/11  6:50 PM      Component Value Range Status Comment   Specimen Description TRACHEAL ASPIRATE   Final    Special Requests NONE   Final    Gram Stain     Final    Value: RARE WBC PRESENT,BOTH PMN AND MONONUCLEAR     NO SQUAMOUS EPITHELIAL CELLS SEEN     RARE GRAM NEGATIVE RODS   Culture MODERATE PSEUDOMONAS AERUGINOSA   Final    Report Status 01/03/2012 FINAL   Final    Organism ID, Bacteria PSEUDOMONAS AERUGINOSA   Final   CULTURE, BLOOD (ROUTINE X 2)     Status: Normal (Preliminary result)   Collection Time   01/11/12  2:00 PM      Component Value Range Status Comment   Specimen Description BLOOD LEFT HAND   Final    Special Requests BOTTLES  DRAWN AEROBIC ONLY 8CC   Final    Culture  Setup Time [id number]   Final    Culture     Final    Value:        BLOOD CULTURE RECEIVED NO GROWTH TO DATE CULTURE WILL BE HELD FOR 5 DAYS BEFORE ISSUING A FINAL NEGATIVE REPORT   Report Status PENDING   Incomplete   CULTURE, BLOOD (ROUTINE X 2)     Status: Normal (Preliminary result)   Collection Time   01/11/12  3:00 PM      Component Value Range Status Comment   Specimen Description BLOOD HAND LEFT   Final    Special Requests BOTTLES DRAWN AEROBIC ONLY 2CC   Final    Culture  Setup Time [id number]   Final    Culture     Final    Value:        BLOOD CULTURE RECEIVED NO GROWTH TO DATE CULTURE WILL BE HELD FOR 5 DAYS BEFORE ISSUING A FINAL NEGATIVE REPORT   Report Status PENDING   Incomplete  CULTURE, RESPIRATORY     Status: Normal   Collection Time   01/11/12  3:34 PM      Component Value Range Status Comment   Specimen Description TRACHEAL ASPIRATE   Final    Special Requests NONE   Final    Gram Stain     Final    Value: MODERATE WBC PRESENT, PREDOMINANTLY PMN     NO SQUAMOUS EPITHELIAL CELLS SEEN     FEW GRAM NEGATIVE RODS   Culture MODERATE PSEUDOMONAS AERUGINOSA   Final    Report Status 01/14/2012 FINAL   Final    Organism ID, Bacteria PSEUDOMONAS AERUGINOSA   Final   URINE CULTURE     Status: Normal   Collection Time   01/11/12  6:48 PM      Component Value Range Status Comment   Specimen Description URINE, CLEAN CATCH   Final    Special Requests NONE   Final    Culture  Setup Time [id number]   Final    Colony Count 35,000 COLONIES/ML   Final    Culture     Final    Value: Multiple bacterial morphotypes present, none predominant. Suggest appropriate recollection if clinically indicated.   Report Status 01/12/2012 FINAL   Final      Impression/Recommendation  71 yo with worsening HCAP with pseudomonas with emerging Drug resistance  --reasonable to double cover in these circumstances and will use imipenem and  gentamicin --followup cultures, clinical status   Thank you so much for this interesting consult,   Acey Lav 01/14/2012, 4:53 PM   [phone] (pager) [phone] (office)

## 2012-01-14 NOTE — Progress Notes (Signed)
Name: Jeremy Vance MRN: [id number] DOB: [birth year]    LOS: 19 Requesting MD:  Arelia Longest Regarding: Trach/vent dependent post pna and PEA arrest.  [place] PCCM Progress Note  History of Present Illness: 71 yo wm with hx of copd and tobacco abuse. He was tx for pna at [facility] and also suffered PEA arrest. He was at [facility] x 1 month. Transferred to [facility] 4/4 with tracheostomy, peg for rehabilitation and liberation from mechanical ventilatory support.   Lines / Drains: Trach>> Rt picc>> Peg>>  Cultures:  Sputum 4/9: MODERATE PSEUDOMONAS AERUGINOSA (pansens)  Antibiotics: Per primary team   Subjective: Awake and follows commands. On full vent support  Vital Signs:   Vital signs reviewed. Abnormal values will appear under impression plan section.    Physical Examination: General:  Elderly wm, on vent, temporal wasting Neuro:  rass -1 HEENT:  Trach clean Cardiovascular:  RRR s M Lungs:  No wheeze. Scattered rhonchi left worse Abdomen:  Peg site clean Musculoskeletal: 1+ edema, chronic stasis changes Skin:  As noted    Labs and Imaging:    Lab 01/13/12 0500 01/12/12 0520 01/11/12 0530  NA 140 145 143  K 4.0 4.2 4.1  CL 93* 98 99  CO2 45* 41* 42*  BUN 31* 32* 34*  CREATININE 0.29* 0.27* 0.31*  GLUCOSE 155* 83 78    Lab 01/13/12 0500 01/12/12 0520 01/11/12 0530  HGB 8.0* 8.7* 8.2*  HCT 26.8* 29.3* 27.6*  WBC 15.6* 23.7* 23.6*  PLT 266 309 261    Dg Chest Port 1 View  01/14/2012  *RADIOLOGY REPORT*  Clinical Data: Tracheostomy.  Pneumonia  PORTABLE CHEST - 1 VIEW  Comparison: 01/13/2012  Findings: Left lower lobe consolidation shows mild progression. This is probably pneumonia.  Milder right lower lobe consolidation is unchanged.  Tracheostomy is unchanged.  COPD is present. Left arm PICC tip is in the SVC, unchanged.   IMPRESSION: Worsening aeration in the left lower lobe compatible with pneumonia and collapse.  Probable left pleural effusion.  No change in mild right lower lobe consolidation.  Original Report Authenticated By: Camelia Phenes, M.D.   Dg Chest Port 1 View  01/13/2012  *RADIOLOGY REPORT*  Clinical Data: Ventilator dependence.  Hypoxia.  PORTABLE CHEST - 1 VIEW  Comparison: 01/11/2012  Findings: 0654 hours.  The bibasilar atelectasis/infiltrate persist, left greater than right, without substantial change.  Left PICC line tip projects at the proximal SVC level and appears to have been pulled back slightly in the interval.  Tracheostomy tube remains in place.  Heart size is stable.  IMPRESSION: No substantial change in cardiopulmonary exam.  Right PICC line tip is more proximal in the SVC than on the prior study.  Original Report Authenticated By: ERIC A. MANSELL, M.D.      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  Complicated by Pansens Pseudomonas tracheobronchitis.  NOw new LLL infiltrates and worsening sensitivity to pseudo -continue bronchodilators In setting worsening LLL infiltrate, recent leukocytosis, new organisms? reoccureent pseudo PNA>/? Has pseudo 4/20 now worsening sens and LLL and clinically worse Would follow pcxr in am  -attempted and did 1.45 min PS 18 -continue imipenem, would NOT use cipro as monotherpay, consider double coverage with rapid resistance to this organism Consider ID consult Neb theapy? tobi nebs? Add cipro for now  DM , exacerbated by steroids  Plan: Per #1 service  Failure to turn the corner? Comfort?   Mcarthur Rossetti. Tyson Alias, MD, FACP Pgr: [phone] [facility]

## 2012-01-15 ENCOUNTER — Other Ambulatory Visit (HOSPITAL_COMMUNITY): Payer: Self-pay

## 2012-01-15 LAB — CBC
Hemoglobin: 8.7 g/dL — ABNORMAL LOW (ref 13.0–17.0)
MCH: 29.7 pg (ref 26.0–34.0)
MCHC: 29.8 g/dL — ABNORMAL LOW (ref 30.0–36.0)
Platelets: 271 10*3/uL (ref 150–400)

## 2012-01-15 LAB — BASIC METABOLIC PANEL
BUN: 29 mg/dL — ABNORMAL HIGH (ref 6–23)
CO2: 43 mEq/L (ref 19–32)
Creatinine, Ser: 0.28 mg/dL — ABNORMAL LOW (ref 0.50–1.35)
Potassium: 4.4 mEq/L (ref 3.5–5.1)
Sodium: 138 mEq/L (ref 135–145)

## 2012-01-15 NOTE — Progress Notes (Signed)
Subjective: Pt was undergoing  Bronchoscopy and Dr. Tyson Alias showed me the totally occluded left main stem bronchus filled with pus  Antibiotics:  Anti-infectives    None      Medications: Scheduled Meds:   Continuous Infusions:   PRN Meds:.     Objective: Weight change:  No intake or output data in the 24 hours ending 01/15/12 1923 There were no vitals taken for this visit.    Physical Exam: Pt undergoing bronchoscopy as above Lab Results:  Basename 01/15/12 0624 01/14/12 1501  WBC 12.7* 16.0*  HGB 8.7* 8.5*  HCT 29.2* 28.8*  PLT 271 285    BMET  Basename 01/15/12 0624 01/14/12 1501  NA 138 139  K 4.4 4.6  CL 91* 93*  CO2 43* 39*  GLUCOSE 187* 89  BUN 29* 27*  CREATININE 0.28* 0.23*  CALCIUM 9.2 9.0    Micro Results: Recent Results (from the past 240 hour(s))  CULTURE, BLOOD (ROUTINE X 2)     Status: Normal (Preliminary result)   Collection Time   01/11/12  2:00 PM      Component Value Range Status Comment   Specimen Description BLOOD LEFT HAND   Final    Special Requests BOTTLES DRAWN AEROBIC ONLY 8CC   Final    Culture  Setup Time [id number]   Final    Culture     Final    Value:        BLOOD CULTURE RECEIVED NO GROWTH TO DATE CULTURE WILL BE HELD FOR 5 DAYS BEFORE ISSUING A FINAL NEGATIVE REPORT   Report Status PENDING   Incomplete   CULTURE, BLOOD (ROUTINE X 2)     Status: Normal (Preliminary result)   Collection Time   01/11/12  3:00 PM      Component Value Range Status Comment   Specimen Description BLOOD HAND LEFT   Final    Special Requests BOTTLES DRAWN AEROBIC ONLY 2CC   Final    Culture  Setup Time [id number]   Final    Culture     Final    Value:        BLOOD CULTURE RECEIVED NO GROWTH TO DATE CULTURE WILL BE HELD FOR 5 DAYS BEFORE ISSUING A FINAL NEGATIVE REPORT   Report Status PENDING   Incomplete   CULTURE, RESPIRATORY     Status: Normal   Collection Time   01/11/12  3:34 PM      Component Value Range Status Comment   Specimen Description TRACHEAL ASPIRATE   Final    Special Requests NONE   Final    Gram Stain     Final    Value: MODERATE WBC PRESENT, PREDOMINANTLY PMN     NO SQUAMOUS EPITHELIAL CELLS SEEN     FEW GRAM NEGATIVE RODS   Culture MODERATE PSEUDOMONAS AERUGINOSA   Final    Report Status 01/14/2012 FINAL   Final    Organism ID, Bacteria PSEUDOMONAS AERUGINOSA   Final   URINE CULTURE     Status: Normal   Collection Time   01/11/12  6:48 PM      Component Value Range Status Comment   Specimen Description URINE, CLEAN CATCH   Final    Special Requests NONE   Final    Culture  Setup Time [id number]   Final    Colony Count 35,000 COLONIES/ML   Final    Culture     Final    Value: Multiple bacterial morphotypes present, none predominant. Suggest appropriate recollection  if clinically indicated.   Report Status 01/12/2012 FINAL   Final     Studies/Results: Dg Chest Port 1 View  01/15/2012  *RADIOLOGY REPORT*  Clinical Data: 71 year old male status post bronchoscopy.  PORTABLE CHEST - 1 VIEW  Comparison: 01/14/2012 and earlier.  Findings: Portable semi upright AP view 1650 hours.  Stable tracheostomy tube. Stable cardiomegaly and mediastinal contours. Left PICC line remains in place, but has been pulled back with the tip now above the carina.  No pneumothorax.  Upper lobe emphysema. Left lung base consolidation and pleural effusion.  No significant change since 01/11/2012.  IMPRESSION: 1.  Left lower lobe consolidation and pleural effusion not significantly changed since 01/11/2012. 2.  Left PICC line has been pulled back, tip now above the carina. Probably at the innominate vein level.  Original Report Authenticated By: Harley Hallmark, M.D.   Dg Chest Port 1 View  01/14/2012  *RADIOLOGY REPORT*  Clinical Data: Tracheostomy.  Pneumonia  PORTABLE CHEST - 1 VIEW  Comparison: 01/13/2012  Findings: Left lower lobe consolidation shows mild progression. This is probably pneumonia.  Milder right lower lobe  consolidation is unchanged.  Tracheostomy is unchanged.  COPD is present. Left arm PICC tip is in the SVC, unchanged.  IMPRESSION: Worsening aeration in the left lower lobe compatible with pneumonia and collapse.  Probable left pleural effusion.  No change in mild right lower lobe consolidation.  Original Report Authenticated By: Camelia Phenes, M.D.      Assessment/Plan: Selby Foisy is a 71 y.o. male with worsening HCAP with pseudomonas with emerging Drug resistance  Now found to have totally occluded left main stem bronchus sp broncoscopy and suctioning out of secretions by Dr. Tyson Alias during bronchoscopy  --continue imipenem, gentamicin and will add tobramycin       LOS: 20 days   Acey Lav 01/15/2012, 7:23 PM

## 2012-01-15 NOTE — Progress Notes (Signed)
Name: Jeremy Vance MRN: [id number] DOB: 12/09/1940    LOS: 20 Requesting MD:  Arelia Longest Regarding: Trach/vent dependent post pna and PEA arrest.  [place] PCCM Progress Note  History of Present Illness: 71 yo wm with hx of copd and tobacco abuse. He was tx for pna at [facility] and also suffered PEA arrest. He was at [facility] x 1 month. Transferred to [facility] 4/4 with tracheostomy, peg for rehabilitation and liberation from mechanical ventilatory support.   Lines / Drains: Trach>> Rt picc>> Peg>>  Cultures:  Sputum 4/9: MODERATE PSEUDOMONAS AERUGINOSA (pansens)  Antibiotics: Per primary team   Subjective: Awake and follows commands. On full vent support  Vital Signs:   Vital signs reviewed. Abnormal values will appear under impression plan section.    Physical Examination: General:  Elderly wm, on vent, temporal wasting Neuro:  rass -follows commands and communicates non verbally HEENT:  Trach clean Cardiovascular:  RRR s M Lungs:  No wheeze. Scattered rhonchi left worse Abdomen:  Peg site clean Musculoskeletal: 1+ edema, chronic stasis changes Skin:  As noted    Labs and Imaging:    Lab 01/15/12 0624 01/14/12 1501 01/13/12 0500  NA 138 139 140  K 4.4 4.6 4.0  CL 91* 93* 93*  CO2 43* 39* 45*  BUN 29* 27* 31*  CREATININE 0.28* 0.23* 0.29*  GLUCOSE 187* 89 155*    Lab 01/15/12 0624 01/14/12 1501 01/13/12 0500  HGB 8.7* 8.5* 8.0*  HCT 29.2* 28.8* 26.8*  WBC 12.7* 16.0* 15.6*  PLT 271 285 266    Dg Chest Port 1 View  01/14/2012  *RADIOLOGY REPORT*  Clinical Data: Tracheostomy.  Pneumonia  PORTABLE CHEST - 1 VIEW  Comparison: 01/13/2012  Findings: Left lower lobe consolidation shows mild progression. This is probably pneumonia.  Milder right lower lobe consolidation is unchanged.  Tracheostomy is unchanged.  COPD is  present. Left arm PICC tip is in the SVC, unchanged.  IMPRESSION: Worsening aeration in the left lower lobe compatible with pneumonia and collapse.  Probable left pleural effusion.  No change in mild right lower lobe consolidation.  Original Report Authenticated By: Camelia Phenes, M.D.      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 Complicated by Pansens Pseudomonas tracheobronchitis.  NOw new LLL infiltrates and worsening sensitivity to pseudo -agree addition gent, appreciate ID -PS 16-18 still needed, pt complains of SOB, and LLL new infiltrate -consider bronch, assess collapse, if significant, would add mucomysts and may lean [place] toward inhaled therapy -pcxr in am  -avoid steroids with pseudo  DM , exacerbated by steroids  Plan: Per #1 service  Brett Canales Minor ACNP [facility] Pager [phone] till 3 pm If no answer page [phone] 01/15/2012, 8:30 AM  I have fully examined this patient and agree with above findings.    And edited.  Lavon Paganini. Titus Mould, MD, [place] Pgr: [place] [facility]

## 2012-01-15 NOTE — Procedures (Signed)
Bronchoscopy Procedure Note Jeremy Vance [id number] [birth year]  Procedure: Bronchoscopy Indications: Diagnostic evaluation of the airways and Remove secretions  Procedure Details Consent: Risks of procedure as well as the alternatives and risks of each were explained to the (patient/caregiver).  Consent for procedure obtained. from wife aware, risk PTX, hypoxia, fevers Time Out: Verified patient identification, verified procedure, site/side was marked, verified correct patient position, special equipment/implants available, medications/allergies/relevent history reviewed, required imaging and test results available.  Performed  In preparation for procedure, patient was given 100% FiO2 and bronchoscope lubricated. Sedation: Benzodiazepines  Airway entered and the following bronchi were examined: LUL, LLL and Bronchi.   Procedures performed: Brushings performed Bronchoscope removed.    Evaluation Hemodynamic Status: BP stable throughout; O2 sats: stable throughout Patient's Current Condition: stable Specimens:   purulent fluid Complications: No apparent complications Patient did tolerate procedure well.   Nelda Bucks. 01/15/2012  1. Complete collapse left main thick white tinged, all lobes left LLL worse then upper div 2. Saline washed clear  Tolerated well, no desat Peep to 8, mucomyst adde when available, on back order Chest pt pcxr now Mcarthur Rossetti. Tyson Alias, MD, FACP Pgr: [phone] [place] [facility]

## 2012-01-16 ENCOUNTER — Other Ambulatory Visit (HOSPITAL_COMMUNITY): Payer: Self-pay

## 2012-01-16 LAB — BASIC METABOLIC PANEL
CO2: 42 mEq/L (ref 19–32)
Calcium: 8.9 mg/dL (ref 8.4–10.5)
Creatinine, Ser: 0.31 mg/dL — ABNORMAL LOW (ref 0.50–1.35)
Glucose, Bld: 178 mg/dL — ABNORMAL HIGH (ref 70–99)

## 2012-01-16 LAB — CBC
Hemoglobin: 8.4 g/dL — ABNORMAL LOW (ref 13.0–17.0)
MCH: 29.8 pg (ref 26.0–34.0)
MCV: 98.2 fL (ref 78.0–100.0)
RBC: 2.82 MIL/uL — ABNORMAL LOW (ref 4.22–5.81)

## 2012-01-16 NOTE — Progress Notes (Signed)
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  [name] PCCM Progress Note  History of Present Illness: 71 yo wm with hx of copd and tobacco abuse. He was tx for pna at [facility] and also suffered PEA arrest. He was at [facility] x 1 month. Transferred to [facility] 4/4 with tracheostomy, peg for rehabilitation and liberation from mechanical ventilatory support.   Lines / Drains: Trach>> Rt picc>> Peg>>  Cultures:  Sputum 4/9: MODERATE PSEUDOMONAS AERUGINOSA (pansens) 4/20 pseudo ( increased res pattern)  Antibiotics: Imipenem 4/20>>> Gent 4/20>>> tobi nebs 4/24>>>  Subjective: 4/24- bronch full left collapse, peep to 8 , tobi nebs started  Vital Signs:   Vital signs reviewed. Abnormal values will appear under impression plan section.    Physical Examination: General:  on vent, temporal wasting Neuro:  rass -follows commands , nonfocal HEENT:  Trach clean Cardiovascular:  RRR s M Lungs:  Open LLL, coarse Abdomen:  Peg site clean Musculoskeletal: 1+ edema, chronic stasis changes Skin:  As noted    Labs and Imaging:    Lab 01/16/12 0637 01/15/12 0624 01/14/12 1501  NA 131* 138 139  K 4.7 4.4 4.6  CL 87* 91* 93*  CO2 42* 43* 39*  BUN 28* 29* 27*  CREATININE 0.31* 0.28* 0.23*  GLUCOSE 178* 187* 89    Lab 01/16/12 0637 01/15/12 0624 01/14/12 1501  HGB 8.4* 8.7* 8.5*  HCT 27.7* 29.2* 28.8*  WBC 12.5* 12.7* 16.0*  PLT 249 271 285    Dg Chest Port 1 View  01/16/2012  *RADIOLOGY REPORT*  Clinical Data: 71 year old male with respiratory failure. Tracheostomy.  PORTABLE CHEST - 1 VIEW  Comparison: 01/15/2012 and earlier.  Findings: Portable semi upright AP view at 0639 hours.  Stable tracheostomy tube.  The patient is less rotated  today.  Stable left PICC line since yesterday, tip at the level of the left innominate vein. Large lung volumes with continued confluent left base and retrocardiac opacity.  No pneumothorax or edema.  No large effusion.  Stable cardiac size and mediastinal contours.  IMPRESSION: 1.  Stable left PICC line that remains pulled back to the level of the left innominate vein, on 01/14/2012 the tip is at the level of the SVC. 2.  Stable ventilation with retrocardiac and left basilar confluent opacity.  Original Report Authenticated By: Harley Hallmark, M.D.   Dg Chest Port 1 View  01/15/2012  *RADIOLOGY REPORT*  Clinical Data: 71 year old male status post bronchoscopy.  PORTABLE CHEST - 1 VIEW  Comparison: 01/14/2012 and earlier.  Findings: Portable semi upright AP view 1650 hours.  Stable tracheostomy tube. Stable cardiomegaly and mediastinal contours. Left PICC line remains in place, but has been pulled back with the tip now above the carina.  No pneumothorax.  Upper lobe  emphysema. Left lung base consolidation and pleural effusion.  No significant change since 01/11/2012.  IMPRESSION: 1.  Left lower lobe consolidation and pleural effusion not significantly changed since 01/11/2012. 2.  Left PICC line has been pulled back, tip now above the carina. Probably at the innominate vein level.  Original Report Authenticated By: Harley Hallmark, M.D.     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 Complicated by Pansens Pseudomonas tracheobronchitis.  NOw new LLL infiltrates and worsening sensitivity to pseudo Bronch 4/24 collapse left main , LLL -appreciate ID help, tobi nebs started -would keep peep 8 till am then reduce If wean sought could use CPAP 8 PS 16 attempts, benefit? Today pcxr in am  -maintain current MV -BDers Limit steroids pred to 10 if able in setting pseudomonas  I have fully examined this patient and agree with above findings.    And  edited.  Mcarthur Rossetti. Tyson Alias, MD, FACP Pgr: [phone] [facility]

## 2012-01-17 ENCOUNTER — Other Ambulatory Visit (HOSPITAL_COMMUNITY): Payer: Self-pay

## 2012-01-17 DIAGNOSIS — J151 Pneumonia due to Pseudomonas: Secondary | ICD-10-CM

## 2012-01-17 LAB — CBC
MCH: 29.9 pg (ref 26.0–34.0)
MCV: 98.5 fL (ref 78.0–100.0)
Platelets: 223 10*3/uL (ref 150–400)
RBC: 2.61 MIL/uL — ABNORMAL LOW (ref 4.22–5.81)
RDW: 19 % — ABNORMAL HIGH (ref 11.5–15.5)

## 2012-01-17 LAB — CULTURE, BLOOD (ROUTINE X 2)
Culture  Setup Time: 201304202147
Culture: NO GROWTH

## 2012-01-17 LAB — BASIC METABOLIC PANEL
CO2: 42 mEq/L (ref 19–32)
Calcium: 8.9 mg/dL (ref 8.4–10.5)
Creatinine, Ser: 0.23 mg/dL — ABNORMAL LOW (ref 0.50–1.35)

## 2012-01-17 NOTE — Progress Notes (Signed)
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  [facility] PCCM Progress Note  History of Present Illness: 71 yo wm with hx of copd and tobacco abuse. He was tx for pna at [facility] and also suffered PEA arrest. He was at [facility] x 1 month. Transferred to [facility] 4/4 with tracheostomy, peg for rehabilitation and liberation from mechanical ventilatory support.   Lines / Drains: Trach>> Rt picc>> Peg>>  Cultures:  Sputum 4/9: MODERATE PSEUDOMONAS AERUGINOSA (pansens) 4/20 pseudo ( increased res pattern)  Antibiotics: Imipenem 4/20>>> Gent 4/20>>> tobi nebs 4/24>>>  Subjective: 4/24- bronch full left collapse, peep to 8 , tobi nebs started  Vital Signs:   Vital signs reviewed. Abnormal values will appear under impression plan section.    Physical Examination: General:  on vent, temporal wasting Neuro:  rass -follows commands , nonfocal HEENT:  Trach clean Cardiovascular:  RRR s M Lungs:  Open LLL, coarse Abdomen:  Peg site clean Musculoskeletal: 1+ edema, chronic stasis changes Skin:  As noted    Labs and Imaging:    Lab 01/17/12 0703 01/16/12 0637 01/15/12 0624  NA 133* 131* 138  K 4.4 4.7 4.4  CL 87* 87* 91*  CO2 42* 42* 43*  BUN 25* 28* 29*  CREATININE 0.23* 0.31* 0.28*  GLUCOSE 210* 178* 187*    Lab 01/17/12 0703 01/16/12 0637 01/15/12 0624  HGB 7.8* 8.4* 8.7*  HCT 25.7* 27.7* 29.2*  WBC 12.2* 12.5* 12.7*  PLT 223 249 271    Dg Chest Port 1 View  01/17/2012  *RADIOLOGY REPORT*  Clinical Data: Tracheostomy.  PORTABLE CHEST - 1 VIEW  Comparison: 01/16/2012.  Findings: Tracheostomy tube tip midline upper trachea.  Rotation of the chest the left.  PICC line tip appears to be in the region of the left bracheocephalic vein.   Asymmetric air space disease greater on the left and most notable in the lower lung zones.  Bullae/blebs upper lung zones greater left suspected.  Cardiomegaly.  Limited evaluation of mediastinal structures and aorta.  Right lung apex not included on the present exam.  No gross pneumothorax.  IMPRESSION: Rotated exam without significant change.  Please see above.  Original Report Authenticated By: Fuller Canada, M.D.   Dg Chest Port 1 View  01/16/2012  *RADIOLOGY REPORT*  Clinical Data: 71 year old male with respiratory failure. Tracheostomy.  PORTABLE CHEST - 1 VIEW  Comparison: 01/15/2012 and earlier.  Findings: Portable semi upright AP view at 0639 hours.  Stable tracheostomy tube.  The patient is less rotated today.  Stable left PICC line since yesterday, tip at the level of the left innominate vein. Large lung volumes with continued confluent left base and retrocardiac opacity.  No pneumothorax or edema.  No large effusion.  Stable cardiac size and mediastinal contours.  IMPRESSION: 1.  Stable left PICC line that remains pulled back to the level of the left innominate vein, on 01/14/2012 the tip is at the level of the SVC. 2.  Stable ventilation with retrocardiac and left basilar confluent opacity.  Original Report Authenticated By: Harley Hallmark, M.D.   Dg Chest Port 1 View  01/15/2012  *RADIOLOGY REPORT*  Clinical Data: 71 year old male status post bronchoscopy.  PORTABLE CHEST - 1 VIEW  Comparison: 01/14/2012 and earlier.  Findings: Portable semi upright AP view 1650 hours.  Stable tracheostomy tube. Stable cardiomegaly and mediastinal contours. Left PICC line remains in place, but has been pulled back with the tip now above the carina.  No pneumothorax.  Upper lobe emphysema. Left lung base consolidation and pleural effusion.  No significant change since 01/11/2012.  IMPRESSION: 1.  Left lower lobe consolidation and pleural effusion not significantly changed since 01/11/2012. 2.  Left PICC line has  been pulled back, tip now above the carina. Probably at the innominate vein level.  Original Report Authenticated By: Harley Hallmark, M.D.     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 Complicated by Pansens Pseudomonas tracheobronchitis.  NOw new LLL infiltrates and worsening sensitivity to pseudo Bronch 4/24 collapse left main , LLL -appreciate ID help, tobi nebs started -peep to 5 today mucomysts to dc Chest pt to continue PS wean started 15, success 5rto 12-13, would do this 4 hours if able Consider bid weaning on PS Maintain current ABX and tobi nebs No repeat bronch needed pcxr this am may have some improvement  Mcarthur Rossetti. Tyson Alias, MD, FACP Pgr: [phone] [facility]

## 2012-01-17 NOTE — Progress Notes (Signed)
Subjective: Pt sleeping Antibiotics:  Anti-infectives    None     Antibiotics:  Imipenem 4/20>>>  Gent 4/20>>>  tobi nebs 4/24>>>  Medications: Scheduled Meds:   Continuous Infusions:   PRN Meds:.     Objective: Weight change:  No intake or output data in the 24 hours ending 01/17/12 1750 There were no vitals taken for this visit.    Physical Exam: Pt asleep Lungs coarse breath sounds Abdomen soft slightly distended nontender.  Right upper extremity with dressing. Lab Results:  Basename 01/17/12 0703 01/16/12 0637  WBC 12.2* 12.5*  HGB 7.8* 8.4*  HCT 25.7* 27.7*  PLT 223 249    BMET  Basename 01/17/12 0703 01/16/12 0637  NA 133* 131*  K 4.4 4.7  CL 87* 87*  CO2 42* 42*  GLUCOSE 210* 178*  BUN 25* 28*  CREATININE 0.23* 0.31*  CALCIUM 8.9 8.9    Micro Results: Recent Results (from the past 240 hour(s))  CULTURE, BLOOD (ROUTINE X 2)     Status: Normal   Collection Time   01/11/12  2:00 PM      Component Value Range Status Comment   Specimen Description BLOOD LEFT HAND   Final    Special Requests BOTTLES DRAWN AEROBIC ONLY [facility]   Final    Culture  Setup Time [id number]   Final    Culture NO GROWTH 5 DAYS   Final    Report Status 01/17/2012 FINAL   Final   CULTURE, BLOOD (ROUTINE X 2)     Status: Normal   Collection Time   01/11/12  3:00 PM      Component Value Range Status Comment   Specimen Description BLOOD HAND LEFT   Final    Special Requests BOTTLES DRAWN AEROBIC ONLY [facility]   Final    Culture  Setup Time [id number]   Final    Culture NO GROWTH 5 DAYS   Final    Report Status 01/17/2012 FINAL   Final   CULTURE, RESPIRATORY     Status: Normal   Collection Time   01/11/12  3:34 PM      Component Value Range Status Comment   Specimen Description TRACHEAL ASPIRATE   Final    Special Requests NONE   Final    Gram Stain     Final    Value: MODERATE WBC PRESENT, PREDOMINANTLY PMN     NO SQUAMOUS EPITHELIAL CELLS SEEN     FEW GRAM NEGATIVE  RODS   Culture MODERATE PSEUDOMONAS AERUGINOSA   Final    Report Status 01/14/2012 FINAL   Final    Organism ID, Bacteria PSEUDOMONAS AERUGINOSA   Final   URINE CULTURE     Status: Normal   Collection Time   01/11/12  6:48 PM      Component Value Range Status Comment   Specimen Description URINE, CLEAN CATCH   Final    Special Requests NONE   Final    Culture  Setup Time [id number]   Final    Colony Count 35,000 COLONIES/ML   Final    Culture     Final    Value: Multiple bacterial morphotypes present, none predominant. Suggest appropriate recollection if clinically indicated.   Report Status 01/12/2012 FINAL   Final     Studies/Results: Dg Chest Port 1 View  01/17/2012  *RADIOLOGY REPORT*  Clinical Data: Tracheostomy.  PORTABLE CHEST - 1 VIEW  Comparison: 01/16/2012.  Findings: Tracheostomy tube tip midline upper trachea.  Rotation of the chest  the left.  PICC line tip appears to be in the region of the left bracheocephalic vein.  Asymmetric air space disease greater on the left and most notable in the lower lung zones.  Bullae/blebs upper lung zones greater left suspected.  Cardiomegaly.  Limited evaluation of mediastinal structures and aorta.  Right lung apex not included on the present exam.  No gross pneumothorax.  IMPRESSION: Rotated exam without significant change.  Please see above.  Original Report Authenticated By: Fuller Canada, M.D.   Dg Chest Port 1 View  01/16/2012  *RADIOLOGY REPORT*  Clinical Data: 71 year old male with respiratory failure. Tracheostomy.  PORTABLE CHEST - 1 VIEW  Comparison: 01/15/2012 and earlier.  Findings: Portable semi upright AP view at 0639 hours.  Stable tracheostomy tube.  The patient is less rotated today.  Stable left PICC line since yesterday, tip at the level of the left innominate vein. Large lung volumes with continued confluent left base and retrocardiac opacity.  No pneumothorax or edema.  No large effusion.  Stable cardiac size and mediastinal  contours.  IMPRESSION: 1.  Stable left PICC line that remains pulled back to the level of the left innominate vein, on 01/14/2012 the tip is at the level of the SVC. 2.  Stable ventilation with retrocardiac and left basilar confluent opacity.  Original Report Authenticated By: Harley Hallmark, M.D.      Assessment/Plan: Jeremy Vance is a 71 y.o. male with worsening HCAP with pseudomonas with emerging Drug resistance he had an occluded left main stem bronchus sp broncoscopy and suctioning out of secretions by Dr. Tyson Alias during bronchoscopy  --continue imipenem, gentamicin and will add tobramycin       LOS: 22 days   Acey Lav 01/17/2012, 5:50 PM

## 2012-01-18 ENCOUNTER — Other Ambulatory Visit (HOSPITAL_COMMUNITY): Payer: Self-pay

## 2012-01-18 LAB — BASIC METABOLIC PANEL
Calcium: 9 mg/dL (ref 8.4–10.5)
Creatinine, Ser: 0.26 mg/dL — ABNORMAL LOW (ref 0.50–1.35)
GFR calc non Af Amer: >90 mL/min (ref >90)
Glucose, Bld: 103 mg/dL — ABNORMAL HIGH (ref 70–99)
Sodium: 134 mEq/L — ABNORMAL LOW (ref 135–145)

## 2012-01-18 LAB — CBC
MCH: 29.7 pg (ref 26.0–34.0)
MCHC: 29.7 g/dL — ABNORMAL LOW (ref 30.0–36.0)
Platelets: 228 10*3/uL (ref 150–400)
RBC: 2.79 MIL/uL — ABNORMAL LOW (ref 4.22–5.81)
RDW: 18.8 % — ABNORMAL HIGH (ref 11.5–15.5)

## 2012-01-19 LAB — CBC
HCT: 25.9 % — ABNORMAL LOW (ref 39.0–52.0)
Platelets: 220 10*3/uL (ref 150–400)
RDW: 18.8 % — ABNORMAL HIGH (ref 11.5–15.5)
WBC: 13.6 10*3/uL — ABNORMAL HIGH (ref 4.0–10.5)

## 2012-01-19 LAB — BASIC METABOLIC PANEL
Chloride: 87 mEq/L — ABNORMAL LOW (ref 96–112)
GFR calc Af Amer: >90 mL/min (ref >90)
GFR calc non Af Amer: >90 mL/min (ref >90)
Potassium: 3.7 mEq/L (ref 3.5–5.1)
Sodium: 135 mEq/L (ref 135–145)

## 2012-01-20 ENCOUNTER — Other Ambulatory Visit (HOSPITAL_COMMUNITY): Payer: Self-pay

## 2012-01-20 LAB — BASIC METABOLIC PANEL
CO2: 39 mEq/L — ABNORMAL HIGH (ref 19–32)
Chloride: 89 mEq/L — ABNORMAL LOW (ref 96–112)
GFR calc Af Amer: >90 mL/min (ref >90)
Potassium: 3.5 mEq/L (ref 3.5–5.1)
Sodium: 134 mEq/L — ABNORMAL LOW (ref 135–145)

## 2012-01-20 NOTE — Progress Notes (Signed)
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  [facility] PCCM Progress Note  History of Present Illness: 71 yo wm with hx of copd and tobacco abuse. He was tx for pna at [facility] and also suffered PEA arrest. He was at [facility] x 1 month. Transferred to [facility] 4/4 with tracheostomy, peg for rehabilitation and liberation from mechanical ventilatory support.   Lines / Drains: Trach>> Rt picc>> Peg>>  Cultures: Sputum 4/9: MODERATE PSEUDOMONAS AERUGINOSA (pansens) 4/9 urine>>> pseudomonas  4/20 sputum>>>> pseudo ( increased res pattern) 4/20 BCx2>>> neg   Antibiotics (per ID): Imipenem 4/20>>> Gent 4/20>>> tobi nebs 4/24>>>  Subjective: C/o mild SOB.  Waiting for BD.  Tol PS x6 hours 4/28, planned 10 hours today   Vital Signs:  Vital signs reviewed. Abnormal values will appear under impression plan section.    Physical Examination: General:  on vent, temporal wasting, NAD Neuro:  Awake and alert, appropriate, follows commands, gen weakness HEENT:  Trach clean, mm dry Cardiovascular:  RRR s M Lungs:  resps even non labored on full support, diminished throughout, few scattered ronchi Abdomen:  Peg site clean Musculoskeletal: 1+ edema, chronic stasis changes     Labs and Imaging:    Lab 01/20/12 0643 01/19/12 0510 01/18/12 0720  NA 134* 135 134*  K 3.5 3.7 4.1  CL 89* 87* 88*  CO2 39* 45* 43*  BUN 31* 31* 26*  CREATININE 0.26* 0.24* 0.26*  GLUCOSE 167* 104* 103*    Lab 01/19/12 0510 01/18/12 0720 01/17/12 0703  HGB 8.0* 8.3* 7.8*  HCT 25.9* 27.9* 25.7*  WBC 13.6* 14.4* 12.2*  PLT 220 228 223    Dg Chest Port 1 View  01/20/2012  *RADIOLOGY REPORT*  Clinical Data: Follow up pneumonia  PORTABLE CHEST - 1 VIEW  Comparison: 01/18/2012  Findings: Cardiomediastinal silhouette is stable.   The patient is rotated to the left.  Stable tracheostomy tube position. Persistent patchy right basilar atelectasis or infiltrate.  There is patchy airspace disease, atelectasis or infiltrate left peri hilar and left lower lobe.  No convincing pulmonary edema.  IMPRESSION:  Persistent patchy right basilar atelectasis or infiltrate.  There is patchy airspace disease, atelectasis or infiltrate left peri hilar and left lower lobe.  No convincing pulmonary edema.  Original Report Authenticated By: Natasha Mead, M.D.     Assessment and Plan:  VDRF s/p trach r/t PNA with underlying COPD and FTT c/b pseudomonas tracheobronchitis--  Recently c/b LLL atx/collapse s/p FOB 4/24.  Tol short periods PS wean.   PLAN -  Cont abx per ID  Cont PS wean per protocol BD  Aggressive pulmonary hygiene Int f/u CXR   WHITEHEART,KATHRYN, NP 01/20/2012  9:37 AM Pager: [phone]  *Care during the described time interval was provided by me and/or other providers on the critical care team. I have reviewed this patient's available data, including medical history, events of note, physical examination and test results as part of my evaluation.   Attending Addendum:  I have seen the patient, discussed the  issues, test results and plans with K. Whiteheart, NP. I agree with the Assessment and Plans as ammended above.   Levy Pupa, MD, PhD 01/20/2012, 12:26 PM [facility] [phone] or if no answer [phone]

## 2012-01-20 NOTE — Progress Notes (Signed)
Subjective: Pt co discomfort on ventilator Antibiotics:  Anti-infectives    None     Antibiotics:  Imipenem 4/20>>>  Gent 4/20>>>  tobi nebs 4/24>>>  Medications: Scheduled Meds:   Continuous Infusions:   PRN Meds:.     Objective: Weight change:  No intake or output data in the 24 hours ending 01/20/12 1421 There were no vitals taken for this visit.    Physical Exam: GEn :ao x 3 Lungs coarse breath sounds Abdomen soft slightly distended nontender.  Right upper extremity with dressing. Lab Results:  Basename 01/19/12 0510 01/18/12 0720  WBC 13.6* 14.4*  HGB 8.0* 8.3*  HCT 25.9* 27.9*  PLT 220 228    BMET  Basename 01/20/12 0643 01/19/12 0510  NA 134* 135  K 3.5 3.7  CL 89* 87*  CO2 39* 45*  GLUCOSE 167* 104*  BUN 31* 31*  CREATININE 0.26* 0.24*  CALCIUM 8.6 8.9    Micro Results: Recent Results (from the past 240 hour(s))  CULTURE, BLOOD (ROUTINE X 2)     Status: Normal   Collection Time   01/11/12  2:00 PM      Component Value Range Status Comment   Specimen Description BLOOD LEFT HAND   Final    Special Requests BOTTLES DRAWN AEROBIC ONLY [facility]   Final    Culture  Setup Time [id number]   Final    Culture NO GROWTH 5 DAYS   Final    Report Status 01/17/2012 FINAL   Final   CULTURE, BLOOD (ROUTINE X 2)     Status: Normal   Collection Time   01/11/12  3:00 PM      Component Value Range Status Comment   Specimen Description BLOOD HAND LEFT   Final    Special Requests BOTTLES DRAWN AEROBIC ONLY [facility]   Final    Culture  Setup Time [id number]   Final    Culture NO GROWTH 5 DAYS   Final    Report Status 01/17/2012 FINAL   Final   CULTURE, RESPIRATORY     Status: Normal   Collection Time   01/11/12  3:34 PM      Component Value Range Status Comment   Specimen Description TRACHEAL ASPIRATE   Final    Special Requests NONE   Final    Gram Stain     Final    Value: MODERATE WBC PRESENT, PREDOMINANTLY PMN     NO SQUAMOUS EPITHELIAL CELLS SEEN   FEW GRAM NEGATIVE RODS   Culture MODERATE PSEUDOMONAS AERUGINOSA   Final    Report Status 01/14/2012 FINAL   Final    Organism ID, Bacteria PSEUDOMONAS AERUGINOSA   Final   URINE CULTURE     Status: Normal   Collection Time   01/11/12  6:48 PM      Component Value Range Status Comment   Specimen Description URINE, CLEAN CATCH   Final    Special Requests NONE   Final    Culture  Setup Time [id number]   Final    Colony Count 35,000 COLONIES/ML   Final    Culture     Final    Value: Multiple bacterial morphotypes present, none predominant. Suggest appropriate recollection if clinically indicated.   Report Status 01/12/2012 FINAL   Final     Studies/Results: Dg Chest Port 1 View  01/20/2012  *RADIOLOGY REPORT*  Clinical Data: Follow up pneumonia  PORTABLE CHEST - 1 VIEW  Comparison: 01/18/2012  Findings: Cardiomediastinal silhouette is stable.  The patient  is rotated to the left.  Stable tracheostomy tube position. Persistent patchy right basilar atelectasis or infiltrate.  There is patchy airspace disease, atelectasis or infiltrate left peri hilar and left lower lobe.  No convincing pulmonary edema.  IMPRESSION:  Persistent patchy right basilar atelectasis or infiltrate.  There is patchy airspace disease, atelectasis or infiltrate left peri hilar and left lower lobe.  No convincing pulmonary edema.  Original Report Authenticated By: Natasha Mead, M.D.      Assessment/Plan: Jeremy Vance is a 71 y.o. male with worsening HCAP with pseudomonas with emerging Drug resistance he had an occluded left main stem bronchus sp broncoscopy and suctioning out of secretions by Dr. Tyson Alias during bronchoscopy  --continue imipenem, gentamicin for total of 15 days --continue inhaled tobramycin while pt receiving the systemic antibiotics --monitor for renal toxicities  I will sign off at this time please call with further questions       LOS: 25 days   Acey Lav 01/20/2012, 2:21 PM

## 2012-01-21 LAB — CBC
HCT: 23.6 % — ABNORMAL LOW (ref 39.0–52.0)
Hemoglobin: 7.2 g/dL — ABNORMAL LOW (ref 13.0–17.0)
MCV: 97.9 fL (ref 78.0–100.0)
RBC: 2.41 MIL/uL — ABNORMAL LOW (ref 4.22–5.81)
RDW: 18.3 % — ABNORMAL HIGH (ref 11.5–15.5)
WBC: 10.1 10*3/uL (ref 4.0–10.5)

## 2012-01-21 LAB — BASIC METABOLIC PANEL
BUN: 29 mg/dL — ABNORMAL HIGH (ref 6–23)
CO2: 42 mEq/L (ref 19–32)
Chloride: 88 mEq/L — ABNORMAL LOW (ref 96–112)
GFR calc Af Amer: >90 mL/min (ref >90)
Glucose, Bld: 217 mg/dL — ABNORMAL HIGH (ref 70–99)
Potassium: 3.9 mEq/L (ref 3.5–5.1)

## 2012-01-21 LAB — ABO/RH: ABO/RH(D): A NEG

## 2012-01-21 NOTE — Procedures (Signed)
Video Bronchoscopy Procedure Note  Date of Operation: 01/21/2012   Pre-op Diagnosis: LLL collapse   Post-op Diagnosis: LLL clot, s/p removal  Surgeon: Levy Pupa  Assistants: none  Anesthesia: conscious sedation, moderate sedation  Meds Given: fentanyl , versed 3mg   Operation: Flexible fiberoptic bronchoscopy  Estimated Blood Loss: none  Complications: none noted  Description of Procedure  Conscious sedation was initiated as indicated above. The video fiberoptic bronchoscope was introduced via the trach tube, this revealed an ectatic trachea, normal main carina. The R sided airways were inspected and showed normal RUL, BI, RML and RLL. The L side was then inspected. The LLL, LUL and Lingular airways were structurally normal.  There was some clot and residual blood in the LLL. This was suctioned to reveal patent airways. No significant mucous plugs or clots were remaining. The patient tolerated the procedure well. There was no new bleeding.   Samples: none  Plans:  Will follow CXR   Levy Pupa, MD, PhD 01/21/2012, 10:31 AM [facility] [phone] or if no answer [phone]

## 2012-01-21 NOTE — Progress Notes (Signed)
                                                                    Name: Nykeem Citro MRN: [id number] DOB: 03-28-1941    LOS: 26 Requesting MD:  Arelia Longest Regarding: Trach/vent dependent post pna and PEA arrest.  [place] PCCM Progress Note  History of Present Illness: 71 yo wm with hx of copd and tobacco abuse. He was tx for pna at [facility] and also suffered PEA arrest. He was at [facility] x 1 month. Transferred to [facility] 4/4 with tracheostomy, peg for rehabilitation and liberation from mechanical ventilatory support.   Lines / Drains: Trach>> Rt picc>> Peg>>  Cultures: Sputum 4/9: MODERATE PSEUDOMONAS AERUGINOSA (pansens) 4/9 urine>>> pseudomonas  4/20 sputum>>>> pseudo ( increased res pattern) 4/20 BCx2>>> neg   Antibiotics (per ID): Imipenem 4/20>>> Gent 4/20>>> tobi nebs 4/24>>>  Subjective: C/o mild SOB.  Waiting for BD.  Tol PS x6 hours 4/28, planned 10 hours today   Vital Signs:  Vital signs reviewed. Abnormal values will appear under impression plan section.    Physical Examination: General:  on vent, temporal wasting, NAD Neuro:  Awake and alert, appropriate, follows commands, gen weakness HEENT:  Trach clean, mm dry Cardiovascular:  RRR s M Lungs:  resps even non labored on full support, diminished throughout, few scattered ronchi Abdomen:  Peg site clean Musculoskeletal: 1+ edema, chronic stasis changes     Labs and Imaging:    Lab 01/21/12 0507 01/20/12 0643 01/19/12 0510  NA 135 134* 135  K 3.9 3.5 3.7  CL 88* 89* 87*  CO2 42* 39* 45*  BUN 29* 31* 31*  CREATININE 0.30* 0.26* 0.24*  GLUCOSE 217* 167* 104*    Lab 01/21/12 0507 01/19/12 0510 01/18/12 0720  HGB 7.2* 8.0* 8.3*  HCT 23.6* 25.9* 27.9*  WBC 10.1 13.6* 14.4*  PLT 190 220 228    Dg Chest Port 1 View  01/20/2012  *RADIOLOGY REPORT*  Clinical Data: Follow up pneumonia  PORTABLE CHEST - 1 VIEW  Comparison: 01/18/2012  Findings: Cardiomediastinal silhouette is stable.  The  patient is rotated to the left.  Stable tracheostomy tube position. Persistent patchy right basilar atelectasis or infiltrate.  There is patchy airspace disease, atelectasis or infiltrate left peri hilar and left lower lobe.  No convincing pulmonary edema.  IMPRESSION:  Persistent patchy right basilar atelectasis or infiltrate.  There is patchy airspace disease, atelectasis or infiltrate left peri hilar and left lower lobe.  No convincing pulmonary edema.  Original Report Authenticated By: Natasha Mead, M.D.     Assessment and Plan:  VDRF s/p trach r/t PNA with underlying COPD and FTT c/b pseudomonas tracheobronchitis--  Recently c/b LLL atx/collapse s/p FOB 4/24.  Tol short periods PS wean.   PLAN -  Cont abx per ID  Cont PS wean per protocol BD  Aggressive pulmonary hygiene Int f/u CXR 4/30 fob   [facility] Minor ACNP Adolph Pollack PCCM Pager [phone] till 3 pm If no answer page [phone] 01/21/2012, 10:09 AM  Levy Pupa, MD, PhD 01/21/2012, 10:28 AM [facility] [phone] or if no answer [phone]

## 2012-01-22 ENCOUNTER — Encounter: Payer: Self-pay | Admitting: Radiology

## 2012-01-22 ENCOUNTER — Other Ambulatory Visit (HOSPITAL_COMMUNITY): Payer: Self-pay

## 2012-01-22 LAB — TYPE AND SCREEN
Antibody Screen: NEGATIVE
Unit division: 0

## 2012-01-22 LAB — BASIC METABOLIC PANEL
CO2: 41 mEq/L (ref 19–32)
Calcium: 8.6 mg/dL (ref 8.4–10.5)
Creatinine, Ser: 0.29 mg/dL — ABNORMAL LOW (ref 0.50–1.35)
Glucose, Bld: 233 mg/dL — ABNORMAL HIGH (ref 70–99)

## 2012-01-22 LAB — CBC
Hemoglobin: 9.1 g/dL — ABNORMAL LOW (ref 13.0–17.0)
MCH: 29.2 pg (ref 26.0–34.0)
MCV: 92 fL (ref 78.0–100.0)
Platelets: 167 10*3/uL (ref 150–400)
RBC: 3.12 MIL/uL — ABNORMAL LOW (ref 4.22–5.81)

## 2012-01-22 MED ORDER — IOHEXOL 300 MG/ML  SOLN
100.0000 mL | Freq: Once | INTRAMUSCULAR | Status: AC | PRN
  Administered 2012-01-22: 100 mL via INTRAVENOUS

## 2012-01-22 NOTE — Progress Notes (Signed)
                                                                    Name: Jeremy Vance MRN: [id number] DOB: November 17, 1940    LOS: 27 Requesting MD:  Arelia Longest Regarding: Trach/vent dependent post pna and PEA arrest.  [place] PCCM Progress Note  History of Present Illness: 71 yo wm with hx of copd and tobacco abuse. He was tx for pna at [facility] and also suffered PEA arrest. He was at [facility] x 1 month. Transferred to [facility] 4/4 with tracheostomy, peg for rehabilitation and liberation from mechanical ventilatory support.   Lines / Drains: Trach>> Rt picc>> Peg>>  Cultures: Sputum 4/9: MODERATE PSEUDOMONAS AERUGINOSA (pansens) 4/9 urine>>> pseudomonas  4/20 sputum>>>> pseudo ( increased res pattern) 4/20 BCx2>>> neg   Antibiotics (per ID): Imipenem 4/20>>> Gent 4/20>>> tobi nebs 4/24>>>  Subjective: Increased wob, increased fio2 needs. Continued blood-tinged secretions last 24h   Physical Examination: General:  on vent, temporal wasting, NAD Neuro:  Awake and alert, appropriate, follows commands, gen weakness HEENT:  Trach clean, mm dry Cardiovascular:  RRR s M Lungs:  resps even non labored on full support, diminished throughout, copious bloody drainage from ett.  Abdomen:  Peg site clean. Abd distended Musculoskeletal: 1+ edema, chronic stasis changes     Labs and Imaging:    Lab 01/21/12 0507 01/20/12 0643 01/19/12 0510  NA 135 134* 135  K 3.9 3.5 3.7  CL 88* 89* 87*  CO2 42* 39* 45*  BUN 29* 31* 31*  CREATININE 0.30* 0.26* 0.24*  GLUCOSE 217* 167* 104*    Lab 01/21/12 0507 01/19/12 0510 01/18/12 0720  HGB 7.2* 8.0* 8.3*  HCT 23.6* 25.9* 27.9*  WBC 10.1 13.6* 14.4*  PLT 190 220 228    No results found.   Assessment and Plan:  VDRF s/p trach r/t PNA with underlying COPD and FTT c/b pseudomonas tracheobronchitis--  Recently c/b LLL atx/collapse s/p FOB 4/24.  Tol short periods PS wean.  Rebronched 4/30 >> no plugging but blood tinged  secretions noted particularly LLL  PLAN -  Cont abx per ID  Cont PS wean per protocol BD  Aggressive pulmonary hygiene Int f/u CXR Defer further FOB, data does not support and no significant plugging seen on 4/30.  Would advocate transition to comfort based care given minimal improvement and progress w weaning Agree with CT scan chest to look for possible malignancy or source bleeding, may help with prognostication and end-of-life discussions. The existing LLL abnormalities on CXR may limit usefulness of the scan in identifying a mass. There were no endobronchial lesions seen on FOB    [facility] Minor ACNP Adolph Pollack PCCM Pager [phone] till 3 pm If no answer page [phone] 01/22/2012, 8:57 AM  Levy Pupa, MD, PhD 01/22/2012, 10:52 AM [facility] [phone] or if no answer [phone]

## 2012-01-23 ENCOUNTER — Other Ambulatory Visit (HOSPITAL_COMMUNITY): Payer: Self-pay

## 2012-01-23 LAB — CBC
MCH: 28.9 pg (ref 26.0–34.0)
MCV: 93.4 fL (ref 78.0–100.0)
Platelets: 187 10*3/uL (ref 150–400)
RDW: 18.7 % — ABNORMAL HIGH (ref 11.5–15.5)

## 2012-01-23 LAB — BASIC METABOLIC PANEL
Calcium: 8.7 mg/dL (ref 8.4–10.5)
Creatinine, Ser: 0.22 mg/dL — ABNORMAL LOW (ref 0.50–1.35)
GFR calc Af Amer: >90 mL/min (ref >90)

## 2012-01-24 ENCOUNTER — Other Ambulatory Visit (HOSPITAL_COMMUNITY): Payer: Self-pay

## 2012-01-24 LAB — LIPASE, BLOOD: Lipase: 31 U/L (ref 11–59)

## 2012-01-24 MED ORDER — IOHEXOL 300 MG/ML  SOLN
100.0000 mL | Freq: Once | INTRAMUSCULAR | Status: AC | PRN
  Administered 2012-01-24: 100 mL via INTRAVENOUS

## 2012-01-24 MED ORDER — IOHEXOL 300 MG/ML  SOLN
20.0000 mL | INTRAMUSCULAR | Status: AC
  Administered 2012-01-24: 20 mL via ORAL

## 2012-01-24 NOTE — Progress Notes (Signed)
Name: Jeremy Vance MRN: [id number] DOB: 07-Apr-1941    LOS: 29 Requesting MD:  Arelia Longest Regarding: Trach/vent dependent post pna and PEA arrest.  [place] PCCM Progress Note  History of Present Illness: 71 yo wm with hx of copd and tobacco abuse. He was tx for pna at [facility] and also suffered PEA arrest. He was at [facility] x 1 month. Transferred to [facility] 4/4 with tracheostomy, peg for rehabilitation and liberation from mechanical ventilatory support.   Lines / Drains: Trach>> Rt picc>> Peg>>  Cultures: Sputum 4/9: MODERATE PSEUDOMONAS AERUGINOSA (pansens) 4/9 urine>>> pseudomonas  4/20 sputum>>>> pseudo ( increased res pattern) 4/20 BCx2>>> neg   Antibiotics (per ID): Imipenem 4/20>>> Gent 4/20>>> tobi nebs 4/24>>>  Subjective: Increased wob, increased fio2 needs. Continued blood-tinged secretions last 24h   Physical Examination: General:  on vent, temporal wasting, NAD Neuro:  Awake and alert, appropriate, follows commands, gen weakness HEENT:  Trach clean, mm dry Cardiovascular:  RRR s M Lungs:  resps even non labored on full support, diminished throughout, copious bloody drainage from ett.  Abdomen:  Peg site clean. Abd distended Musculoskeletal: 1+ edema, chronic stasis changes     Labs and Imaging:    Lab 01/23/12 0450 01/22/12 1500 01/21/12 0507  NA 133* 131* 135  K 4.3 4.4 3.9  CL 88* 87* 88*  CO2 >45* 41* 42*  BUN 26* 27* 29*  CREATININE 0.22* 0.29* 0.30*  GLUCOSE 237* 233* 217*    Lab 01/23/12 0450 01/22/12 1500 01/21/12 0507  HGB 8.8* 9.1* 7.2*  HCT 28.4* 28.7* 23.6*  WBC 9.7 11.6* 10.1  PLT 187 167 190    Ct Chest W Contrast  01/22/2012  *RADIOLOGY REPORT*  Clinical Data: Left lower lobe infiltrate noted on chest x-ray.  A cardiac arrest.  CT CHEST WITH CONTRAST  Technique:  Multidetector CT imaging of the chest was performed following the standard protocol  during bolus administration of intravenous contrast.  Contrast: OMNIPAQUE IOHEXOL 300 MG/ML  SOLN  Comparison: No priors.  Findings:  Mediastinum: Heart size is normal. There is no significant pericardial fluid, thickening or pericardial calcification. There is atherosclerosis of the thoracic aorta, the great vessels of the mediastinum and the coronary arteries, including calcified atherosclerotic plaque in the left main, left anterior descending, circumflex and right coronary arteries. Calcifications of the mitral subvalvular apparatus. No pathologically enlarged mediastinal or hilar lymph nodes. Left upper extremity power PICC with the tip terminating in the distal left innominate vein.  A tracheostomy tube in place with tip in the upper trachea.  Lungs/Pleura: There is a background of severe paraseptal and centrilobular emphysema with extensive subpleural blebs and bulla, most pronounced throughout the lung apices bilaterally.  There is a combination of collapse and consolidation in the posterior aspect of the left upper lobe and in the left lower lobe.  A moderate left- sided pleural effusion layers dependently.  Trace right-sided pleural fluid is also present.  There are a few ill-defined scattered nodular opacities (examples including and 11 x 7 mm nodule the anterior aspect of the right middle lobe (image 40 of series 102), and an  ill defined 1 cm nodule the posterior aspect of the right upper lobe (image 15 of series 2)), which are nonspecific. Mild diffuse bronchial wall thickening.  Upper Abdomen: There are multiple large soft tissue masses in the expected location of the gastrohepatic ligament that are of uncertain etiology and significance, but are strongly favored to be neoplastic.  These may either represent enlarged lymph nodes, or portions of an incompletely visualized mass (potentially from the pancreas).  Musculoskeletal: Multiple nondisplaced bilateral anterior rib fractures are noted  (potentially related to recent CPR). There are no aggressive appearing lytic or blastic lesions noted in the visualized portions of the skeleton.  IMPRESSION: 1.  Abnormal soft tissue mass in the upper abdomen likely within the gastrohepatic ligament.  This may be a nodal mass, or part of the larger adjacent soft tissue mass (potentially from the pancreas), and is almost certainly malignant.  Further evaluation with contrast enhanced CT of the abdomen and pelvis is highly recommended. 2.  Combination of airspace consolidation and atelectasis in the left lower lobe and to a lesser extent the posterior aspect of the left upper lobe, as above. 3.  Moderate left-sided pleural effusion and small right-sided pleural effusion, both of which layer dependently. 4.  Multiple nonspecific pulmonary nodules, as above.  Given the findings in the abdomen, these are concerning for potential metastatic lesions, however, they may be of infectious or inflammatory origin.  Attention on follow-up studies is recommended. 5.  Extensive paraseptal and centrilobular emphysema, as above. 6.  Multiple nondisplaced anterior rib fractures bilaterally. 7. There are calcifications of the the mitral subvalvular apparatus.  Echocardiographic correlation for evaluation of potential valvular dysfunction may be warranted if clinically indicated.  These results were called by telephone on 01/22/2012  at  04:25 p.m. to  Dr. Christella Hartigan, who verbally acknowledged these results.  Original Report Authenticated By: Florencia Reasons, M.D.   Dg Chest Port 1 View  01/23/2012  *RADIOLOGY REPORT*  Clinical Data: Endotracheal tube.  PORTABLE CHEST - 1 VIEW  Comparison: CT chest and chest radiograph 01/22/2012.  Findings: Patient is rotated.  Tracheostomy is grossly midline. Heart size stable.  There is patchy bilateral air space disease, left greater than right, with a small left pleural effusion. Emphysema with bullous changes on the left.  IMPRESSION:  1.  Left  upper and left lower lobe air space consolidation, worrisome for pneumonia, with a small left parapneumonic effusion. 2.  Developing right basilar airspace disease.  Original Report Authenticated By: Reyes Ivan, M.D.     Assessment and Plan:  VDRF s/p trach r/t PNA with underlying COPD and FTT c/b pseudomonas tracheobronchitis--  Recently c/b LLL atx/collapse s/p FOB 4/24.  Tol short periods PS wean.  Rebronched 4/30 >> no plugging but blood tinged secretions noted particularly LLL. No increased hemoptysis 5/3  PLAN -  Cont abx per ID  Cont PS wean per protocol BD  Aggressive pulmonary hygiene Int f/u CXR Defer further FOB, data does not support and no significant plugging seen on 4/30.  Would advocate transition to comfort based care given minimal improvement and progress w weaning Agree with CT scan chest to look for possible malignancy or source bleeding, may help with prognostication and end-of-life discussions. The existing LLL abnormalities on CXR may limit usefulness of the scan in identifying a mass. There were no endobronchial lesions seen on FOB    [facility] Minor ACNP Ad[facility] Pollack PCCM Pager [phone] till 3 pm If no answer page [phone] 01/24/2012, 10:28  AM    Attending Addendum:  I have seen the patient, discussed the issues, test results and plans with S. Minor, NP. I agree with the Assessment and Plans as ammended above.   Levy Pupa, MD, PhD 01/24/2012, 12:03 PM [facility] [phone] or if no answer [phone]

## 2012-01-25 LAB — CBC
HCT: 28 % — ABNORMAL LOW (ref 39.0–52.0)
MCHC: 30.7 g/dL (ref 30.0–36.0)
RDW: 17.4 % — ABNORMAL HIGH (ref 11.5–15.5)

## 2012-01-25 LAB — BASIC METABOLIC PANEL
BUN: 25 mg/dL — ABNORMAL HIGH (ref 6–23)
GFR calc Af Amer: >90 mL/min (ref >90)
GFR calc non Af Amer: >90 mL/min (ref >90)
Potassium: 4 mEq/L (ref 3.5–5.1)
Sodium: 129 mEq/L — ABNORMAL LOW (ref 135–145)

## 2012-01-27 ENCOUNTER — Other Ambulatory Visit (HOSPITAL_COMMUNITY): Payer: Self-pay

## 2012-01-27 DIAGNOSIS — R932 Abnormal findings on diagnostic imaging of liver and biliary tract: Secondary | ICD-10-CM

## 2012-01-27 LAB — CBC
MCHC: 31 g/dL (ref 30.0–36.0)
MCV: 92.7 fL (ref 78.0–100.0)
Platelets: 255 10*3/uL (ref 150–400)
RDW: 17.2 % — ABNORMAL HIGH (ref 11.5–15.5)
WBC: 7.1 10*3/uL (ref 4.0–10.5)

## 2012-01-27 NOTE — Consult Note (Signed)
Patient seen, examined, and I agree with the above documentation, including the assessment and plan. 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 No clinical evidence for acute pancreatitis, and pt denies a history of prior pancreatitis. Lipase/amylase are normal. Differential includes subclinical acute inflammation, ischemic injury, etc, or cystic neoplasm such as IPMN For now, I recommend ongoing management of acute respiratory infection, etc. Will plan for outpatient, clinic follow-up, with Dr. Christella Hartigan to discuss further pancreatic evaluation, likely with repeat cross-sectional imaging.  The decision regarding possible ERCP/EUS can be made as an outpt. Will check hep panel tomorrow. Call with questions.

## 2012-01-27 NOTE — Consult Note (Signed)
[facility] Consult: 10:48 AM 01/27/2012   Referring Provider: dr Marcene Corning  Primary Care Physician:  [facility] be [facility] for care.  Primary Gastroenterologist:  Dr.  Woody Seller and Katrinka Blazing in [place]   Reason for Consultation:  Incidental finding of pancreatic cysts.   HPI: Jeremy Vance is a 71 y.o. male.  Admitted to [facility] 11/25/2011 to 12/26/2011 with acute on chronic resp failure, sepsis, suffered PEA arrest and required trach for resp failure, PEG for nutritional support.  Transferred to [facility] with aim of weaning off vent which is complicated by pseudomonas tracheobronchitis. Underwent CT scan of chest on 5/3 to evaluate lungs.  Study incidentally found a mass in the Upper abdomen.  The pt then under went CT of abdomen where numerous cystic lesions in the pancreas were seen.  Pt able to nod appropriately to questions and says no prior hx of pancreatic disease, alcohol consumption, nausea, abdominal pain.  No weight loss or anorexia before admission.  Has had colonoscopy in [place] before.  He is unable to communicate how long ago or when it was done.  Denies prior EGD or ulcer disease.  He is not on a PPI or H2 blocker.     Medical History Obesity O2 dependent COPD Type 2 DM Thrombocytopenia Anemia HTN Diastolic heart failure  2006  surgical history Inguinal hernia repair.  Tracheostomy 12/2011 PEG 12/2011   Prior to Admission medications   Not on File    Scheduled Meds:  Gentamycin, primaxin SS novolog insullin, Lantus insulin, Amaryl Alprazolam,  Amiodarone po, Ditiazem po, Metoprolol po Budesonide inh Docusate, Senna syrup,  Ferrous Suffate Glyccopyrrolate artificial tears,  Prednison 10 mg daily Scopalamine patch Sertraline Tobramycin inh.     Allergies as of 12/26/2011  . (No Known Allergies)    No family history on file.  History   Social History  . Marital Status: Single    Spouse Name: N/A   Number of Children: N/A  . Years of Education: N/A   Occupational History  . Not on file.   Social History Main Topics  . Smoking status: Not on file  . Smokeless tobacco: Not on file  . Alcohol Use: Not on file  . Drug Use: Not on file  . Sexually Active: Not on file   Other Topics Concern  . Not on file   Social History Narrative  . No narrative on file    REVIEW OF SYSTEMS: As per HPI.  Unable to get full ROS due to pt can not speak. No dysuria, currently has a foley in place.    PHYSICAL EXAM: Vital signs in last 24 hours: 97.6, 144/72, sat 94%, R 18, weight 248 #   General: Ill looking obese white man. Head:  No assymetry or abrasions  Eyes: EOMI.  No pallor or icterus. Ears:   Seems to hear well.  No hearing aids  Nose:  No congestion Mouth:  Edentulous, moist and clear MM Neck:  No mass.  Trach without bleeding or discharge Lungs:  Diminished but clear in front.  Not able to vocalize.  Heart: RRR.  No MRG Abdomen:  Obese, tight, NT, no mass, no bruits, no HSM.  BS tympanitic.   Rectal: not done   Musc/Skeltl: no joint erythema or deformity Extremities:  No pedal edema.  DP pulse 3 plus on left, 2 plus on right  Neurologic:  Follows commands.  Moves all 4 limbs.  Appropriate.  Skin:  Hyperemic/violaceous skin discoloration  in both lower legs/feet c/w venous insufficiency Tattoos:  none Nodes:  No adenopathy in neck   Psych:  Pleasant.  Not anxious or agitated.  cooperative  LAB RESULTS:  Basename 01/27/12 0400 01/25/12 0500  WBC 7.1 7.1  HGB 9.0* 8.6*  HCT 29.0* 28.0*  PLT 255 216     Ref. Range 01/02/2012 03:35  Iron Latest Range: 42-135 ug/dL 40 (L)  UIBC Latest Range: 125-400 ug/dL 161  TIBC Latest Range: 215-435 ug/dL 096  Saturation Ratios Latest Range: 20-55 % 19 (L)  Ferritin Latest Range: 22-322 ng/mL 622 (H)            BMET Lab Results  Component Value Date   NA 129* 01/25/2012   NA 133* 01/23/2012   NA 131* 01/22/2012   K 4.0 01/25/2012    K 4.3 01/23/2012   K 4.4 01/22/2012   CL 83* 01/25/2012   CL 88* 01/23/2012   CL 87* 01/22/2012   CO2 44* 01/25/2012   CO2 >45* 01/23/2012   CO2 41* 01/22/2012   GLUCOSE 334* 01/25/2012   GLUCOSE 237* 01/23/2012   GLUCOSE 233* 01/22/2012   BUN 25* 01/25/2012   BUN 26* 01/23/2012   BUN 27* 01/22/2012   CREATININE 0.23* 01/25/2012   CREATININE 0.22* 01/23/2012   CREATININE 0.29* 01/22/2012   CALCIUM 8.5 01/25/2012   CALCIUM 8.7 01/23/2012   CALCIUM 8.6 01/22/2012   Lipase                          31                                                                    01/24/2012 Amylase                       24                                                                    01/26/2012   LFT No results found for this basename: PROT:3,ALBUMIN:3,AST:3,ALT:3,ALKPHOS:3,BILITOT:3,BILIDIR:3,IBILI:3 in the last 72 hours   PT/INR Lab Results  Component Value Date   INR 1.10 12/26/2011   Hepatitis Panel No results found for this basename: HEPBSAG,HCVAB,HEPAIGM,HEPBIGM in the last 72 hours   RADIOLOGY STUDIES: Dg Chest Port 1 View  01/27/2012  *RADIOLOGY REPORT*  Clinical Data: Tracheostomy.  COPD.  Respiratory failure.  PORTABLE CHEST - 1 VIEW  Comparison: 01/23/2012  Findings: Mildly degraded exam due to AP portable technique and patient body habitus.  The chin overlies the apices, greater right than left. Tracheostomy appropriately positioned.  Support device artifact overlies left hemithorax. Cardiomegaly accentuated by AP portable technique.  Layering small to moderate left pleural effusion. No pneumothorax.  Improved bilateral aeration.  Persistent left greater than right lower lobe predominant airspace disease.  IMPRESSION:  1.  Overall improved aeration, with decreased left greater than right lower lobe predominant airspace disease. 2.  Multifactorial degradation, as detailed above. 3.  Small to  moderate left pleural effusion, slightly decreased.  Original Report Authenticated By: Consuello Bossier, M.D.    CT chest   01/22/2012 IMPRESSION:  1. Abnormal soft tissue mass in the upper abdomen likely within  the gastrohepatic ligament. This may be a nodal mass, or part of  the larger adjacent soft tissue mass (potentially from the  pancreas), and is almost certainly malignant. Further evaluation  with contrast enhanced CT of the abdomen and pelvis is highly  recommended.  2. Combination of airspace consolidation and atelectasis in the  left lower lobe and to a lesser extent the posterior aspect of the  left upper lobe, as above.  3. Moderate left-sided pleural effusion and small right-sided  pleural effusion, both of which layer dependently.  4. Multiple nonspecific pulmonary nodules, as above. Given the  findings in the abdomen, these are concerning for potential  metastatic lesions, however, they may be of infectious or  inflammatory origin. Attention on follow-up studies is  recommended.  5. Extensive paraseptal and centrilobular emphysema, as above.  6. Multiple nondisplaced anterior rib fractures bilaterally.  7. There are calcifications of the the mitral subvalvular  apparatus. Echocardiographic correlation for evaluation of  potential valvular dysfunction may be warranted if clinically  indicated.   CT ABDOMEN AND PELVIS WITH CONTRAST  01/24/2012 Findings: Images through the lung bases show small left pleural  effusion and left basilar infiltrate or atelectasis. Percutaneous  gastrostomy tube is seen in appropriate position.  Multiple cystic lesions are seen involving the pancreatic head,  body, and tail. The individual cystic areas measure up to 3.7 cm in  maximum diameter in the pancreatic body. There are also similar  cystic collections within the gastrohepatic ligament and transverse  mesocolon. These may represent pancreatic pseudocysts, however  cystic neoplasm such as an intraductal papillary mucinous neoplasm  with main duct involvement cannot be excluded. A small amount of  free fluid  is noted in the left pericolic gutter.  The liver, gallbladder, spleen, adrenal glands, and right kidney  are normal in appearance. A tiny left renal cysts seen, however  there is no evidence of renal mass or hydronephrosis.  A Foley catheter is seen within the bladder. No other masses or  lymphadenopathy identified. No evidence of abscess. No evidence  of bowel obstruction. Generalized gaseous distention of colon is  seen, suspicious for colonic ileus. There is mild diffuse body  wall edema.  IMPRESSION:  1. Multiple cystic lesions throughout the pancreas, with similar  cystic collections within the adjacent gastrohepatic ligament and  transverse mesocolon. These may represent pancreatic pseudocysts,  however differential diagnosis also includes intraductal papillary  mucinous neoplasm with main duct involvement. Recommend  correlation with pancreatic enzymes, and consider ERCP for further  evaluation.  2. Small left pleural effusion and left basilar atelectasis versus  infiltrate. Small amount of free fluid in the left pericolic  gutter.  3. Probable mild colonic ileus.  4. Diffuse body wall edema.   ENDOSCOPIC STUDIES: Prior colonoscopy in [place].  Date, MD not known.   IMPRESSION: 1.  Cystic lesions in pancreas, incidental finding on CT scan.  Lipase, amylase normal.  No chronic or acute GI sxs.  2.  Anemia, normocytic.  Iron level low. Transfused 2 PRBCs of 4/30 3.  DM 2,  4.  resp failure, s./p trach 5.  Obesity.  6.  S/p PEG 12/2011  PLAN: 1.  Per Dr  Rhea Belton.     LOS: 32 days   Jennye Moccasin  01/27/2012,  10:48 AM Pager: [phone]

## 2012-01-28 LAB — BASIC METABOLIC PANEL
BUN: 25 mg/dL — ABNORMAL HIGH (ref 6–23)
Calcium: 8.6 mg/dL (ref 8.4–10.5)
Creatinine, Ser: 0.31 mg/dL — ABNORMAL LOW (ref 0.50–1.35)
GFR calc Af Amer: >90 mL/min (ref >90)
GFR calc non Af Amer: >90 mL/min (ref >90)
Glucose, Bld: 280 mg/dL — ABNORMAL HIGH (ref 70–99)
Potassium: 4.2 mEq/L (ref 3.5–5.1)

## 2012-01-28 LAB — HEPATIC FUNCTION PANEL
AST: 13 U/L (ref 0–37)
Bilirubin, Direct: <0.1 mg/dL (ref 0.0–0.3)
Total Bilirubin: 0.3 mg/dL (ref 0.3–1.2)

## 2012-01-28 NOTE — Progress Notes (Signed)
                                                                    Name: Jeremy Vance MRN: [id number] DOB: 03-31-1941    LOS: 33 Requesting MD:  Arelia Longest Regarding: Trach/vent dependent post pna and PEA arrest.  [place] PCCM Progress Note  History of Present Illness: 71 yo wm with hx of copd and tobacco abuse. He was tx for pna at [facility] and also suffered PEA arrest. He was at [facility] x 1 month. Transferred to [facility] 4/4 with tracheostomy, peg for rehabilitation and liberation from mechanical ventilatory support.   Lines / Drains: Trach>> Rt picc>> Peg>>  Cultures: Sputum 4/9: MODERATE PSEUDOMONAS AERUGINOSA (pansens) 4/9 urine>>> pseudomonas  4/20 sputum>>>> pseudo ( increased res pattern)  Antibiotics (per ID): Imipenem 4/20>>> Gent 4/20>>> tobi nebs 4/24>>>  Subjective: Tolerating pressure support more over past two days.  Physical Examination: Vitals reviewed in bedside chart.  General:  No distress Neuro: Follows simple commands HEENT: Trach site clean Cardiovascular: S1S2 regular Lungs: Decreased breath sounds Lt > Rt base, no wheeze Abdomen: Soft, PEG in place Musculoskeletal: minimal leg edema     Labs and Imaging:    Lab 01/28/12 0447 01/25/12 0500 01/23/12 0450  NA 131* 129* 133*  K 4.2 4.0 4.3  CL 84* 83* 88*  CO2 42* 44* >45*  BUN 25* 25* 26*  CREATININE 0.31* 0.23* 0.22*  GLUCOSE 280* 334* 237*    Lab 01/27/12 0400 01/25/12 0500 01/23/12 0450  HGB 9.0* 8.6* 8.8*  HCT 29.0* 28.0* 28.4*  WBC 7.1 7.1 9.7  PLT 255 216 187    Dg Chest Port 1 View  01/27/2012  *RADIOLOGY REPORT*  Clinical Data: Tracheostomy.  COPD.  Respiratory failure.  PORTABLE CHEST - 1 VIEW  Comparison: 01/23/2012  Findings: Mildly degraded exam due to AP portable technique and patient body habitus.  The chin overlies the apices, greater right than left. Tracheostomy appropriately positioned.  Support device artifact overlies left hemithorax. Cardiomegaly  accentuated by AP portable technique.  Layering small to moderate left pleural effusion. No pneumothorax.  Improved bilateral aeration.  Persistent left greater than right lower lobe predominant airspace disease.  IMPRESSION:  1.  Overall improved aeration, with decreased left greater than right lower lobe predominant airspace disease. 2.  Multifactorial degradation, as detailed above. 3.  Small to moderate left pleural effusion, slightly decreased.  Original Report Authenticated By: Consuello Bossier, M.D.     Assessment and Plan:  VDRF s/p trach r/t PNA with underlying COPD and FTT c/b pseudomonas tracheobronchitis--   S/p FOB for LLL ATX/collapse 4/24, and required repeat FOB 4/30 for hemoptysis.  CT chest 01/22/12>>severe paraseptal and centrilobular emphysema with extensive subpleural blebs and bulla, consolidation/collapse Lt upper and lower lobes, moderate Lt effusion, multiple nodules up to 1 cm  PLAN -  -Pressure support wean as tolerated -Abx per ID -scheduled bronchodilators -bronchial hygiene -f/u CXR 5/9>>if effusion persists may need to consider Lt thoracentesis  Coralyn Helling, MD 01/28/2012, 9:27 AM Pager:  [phone]

## 2012-01-28 NOTE — Progress Notes (Signed)
I agree with the above documentation, including the assessment and plan. Please contact [facility] prior to discharge to arrange outpatient GI evaluation with Dr. Christella Hartigan to discuss panc abnormalities seen by CT abd.

## 2012-01-28 NOTE — Progress Notes (Signed)
     [facility] Daily Rounding Note 01/28/2012, 2:26 PM  SUBJECTIVE:       No acute events.  Denies abd pain.   OBJECTIVE:        Did not reexamine pt today   Lab Results:  Basename 01/27/12 0400  WBC 7.1  HGB 9.0*  HCT 29.0*  PLT 255   BMET  Basename 01/28/12 0447  NA 131*  K 4.2  CL 84*  CO2 42*  GLUCOSE 280*  BUN 25*  CREATININE 0.31*  CALCIUM 8.6   LFT  Basename 01/28/12 0447  PROT 5.4*  ALBUMIN 2.1*  AST 13  ALT 20  ALKPHOS 81  BILITOT 0.3  BILIDIR <0.1  IBILI NOT CALCULATED   ASSESMENT: 1. 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  No sx hx to suggest acute pancreatitis. 2.  Extensive emphysema, COPD.  Recent acute resp failure with PEA arrest,  required trach, PEG.  3. Anemia, normocytic. Iron level low. Transfused 2 PRBCs of 4/30  4. DM 2,  5. Obesity.  6. S/p PEG 12/2011   PLAN: 1.  can follow up with Dr Rob Bunting as outpatient.  Call us when he is approaching discharge so we can arrange office visit.  No inpt studies or further work up planned. Will sign off.    LOS: 33 days   Jennye Moccasin  01/28/2012, 2:26 PM Pager: [phone]

## 2012-01-29 LAB — BASIC METABOLIC PANEL
Chloride: 79 mEq/L — ABNORMAL LOW (ref 96–112)
GFR calc Af Amer: >90 mL/min (ref >90)
GFR calc non Af Amer: >90 mL/min (ref >90)
Potassium: 4.2 mEq/L (ref 3.5–5.1)
Sodium: 130 mEq/L — ABNORMAL LOW (ref 135–145)

## 2012-01-29 LAB — CBC
HCT: 29.8 % — ABNORMAL LOW (ref 39.0–52.0)
Hemoglobin: 9.2 g/dL — ABNORMAL LOW (ref 13.0–17.0)
MCHC: 30.9 g/dL (ref 30.0–36.0)
WBC: 7.9 10*3/uL (ref 4.0–10.5)

## 2012-01-30 ENCOUNTER — Other Ambulatory Visit (HOSPITAL_COMMUNITY): Payer: Self-pay

## 2012-01-30 LAB — BASIC METABOLIC PANEL
BUN: 29 mg/dL — ABNORMAL HIGH (ref 6–23)
Chloride: 77 mEq/L — ABNORMAL LOW (ref 96–112)
GFR calc Af Amer: >90 mL/min (ref >90)
GFR calc non Af Amer: >90 mL/min (ref >90)
Glucose, Bld: 138 mg/dL — ABNORMAL HIGH (ref 70–99)
Potassium: 3.4 mEq/L — ABNORMAL LOW (ref 3.5–5.1)
Sodium: 131 mEq/L — ABNORMAL LOW (ref 135–145)

## 2012-01-30 NOTE — Progress Notes (Signed)
                                                                    Name: Jeremy Vance MRN: [id number] DOB: 1941-05-24    LOS: 35 Requesting MD:  Arelia Longest Regarding: Trach/vent dependent post pna and PEA arrest.  [place] PCCM Progress Note  History of Present Illness: 71 yo wm with hx of copd and tobacco abuse. He was tx for pna at [facility] and also suffered PEA arrest. He was at [facility] x 1 month. Transferred to [facility] 4/4 with tracheostomy, peg for rehabilitation and liberation from mechanical ventilatory support.   Lines / Drains: Trach>> Rt picc>> Peg>>  Cultures: Sputum 4/9: MODERATE PSEUDOMONAS AERUGINOSA (pansens) 4/9 urine>>> pseudomonas  4/20 sputum>>>> pseudomonas ( increased res pattern)  Antibiotics (per ID): Imipenem 4/20>>> Gent 4/20>>> tobi nebs 4/24>>>  Subjective: Tolerating pressure support.  Denies chest pain, abdominal pain.  Physical Examination: Vitals reviewed in bedside chart.  General:  No distress Neuro: Follows simple commands HEENT: Trach site clean Cardiovascular: S1S2 regular Lungs: Decreased breath sounds Lt > Rt base, no wheeze Abdomen: Soft, PEG in place Musculoskeletal: minimal leg edema     Labs and Imaging:    Lab 01/30/12 0450 01/29/12 0402 01/28/12 0447  NA 131* 130* 131*  K 3.4* 4.2 4.2  CL 77* 79* 84*  CO2 >45* >45* 42*  BUN 29* 31* 25*  CREATININE 0.31* 0.35* 0.31*  GLUCOSE 138* 272* 280*    Lab 01/29/12 0402 01/27/12 0400 01/25/12 0500  HGB 9.2* 9.0* 8.6*  HCT 29.8* 29.0* 28.0*  WBC 7.9 7.1 7.1  PLT 290 255 216    Dg Chest Portable 1 View  01/30/2012  *RADIOLOGY REPORT*  Clinical Data: Pneumonia, pleural effusion.  PORTABLE CHEST - 1 VIEW  Comparison: 01/27/2012  Findings: The There is hyperinflation of the lungs compatible with COPD.  Tracheostomy tube and left PICC line remain in place, unchanged.  Bilateral lower lobe airspace opacities again noted, left greater than right, unchanged.  Small left  pleural effusion, stable.  The  IMPRESSION: Continued lower lobe opacities, left greater than right.  Cannot exclude pneumonia.  Stable small left effusion.  COPD, cardiomegaly.  Original Report Authenticated By: Cyndie Chime, M.D.     Assessment and Plan:  VDRF s/p trach r/t PNA with underlying COPD and FTT c/b pseudomonas tracheobronchitis--   S/p FOB for LLL ATX/collapse 4/24, and required repeat FOB 4/30 for hemoptysis.  CT chest 01/22/12>>severe paraseptal and centrilobular emphysema with extensive subpleural blebs and bulla, consolidation/collapse Lt upper and lower lobes, moderate Lt effusion, multiple nodules up to 1 cm  PLAN -  -Pressure support wean as tolerated -Abx per ID -scheduled bronchodilators -bronchial hygiene -small Lt pleural effusion on CXR stable>>no indication for thoracentesis at this time>>f/u CXR next week  Very slow wean process.  Given severity of lung disease not sure if he will be able to wean of vent quickly.  Agree with consideration for vent SNF placement.  PCCM will f/u on 5/13>>call if help needed sooner.  Coralyn Helling, MD 01/30/2012, 10:54 AM Pager:  [phone]

## 2012-01-31 LAB — CBC
HCT: 33.9 % — ABNORMAL LOW (ref 39.0–52.0)
Hemoglobin: 10.5 g/dL — ABNORMAL LOW (ref 13.0–17.0)
MCH: 29.2 pg (ref 26.0–34.0)
MCHC: 31 g/dL (ref 30.0–36.0)
RBC: 3.6 MIL/uL — ABNORMAL LOW (ref 4.22–5.81)

## 2012-01-31 LAB — BASIC METABOLIC PANEL
BUN: 33 mg/dL — ABNORMAL HIGH (ref 6–23)
CO2: >45 mEq/L (ref 19–32)
Glucose, Bld: 136 mg/dL — ABNORMAL HIGH (ref 70–99)
Potassium: 3.3 mEq/L — ABNORMAL LOW (ref 3.5–5.1)
Sodium: 133 mEq/L — ABNORMAL LOW (ref 135–145)

## 2012-02-01 LAB — BASIC METABOLIC PANEL
BUN: 38 mg/dL — ABNORMAL HIGH (ref 6–23)
Chloride: 77 mEq/L — ABNORMAL LOW (ref 96–112)
Creatinine, Ser: 0.35 mg/dL — ABNORMAL LOW (ref 0.50–1.35)
Glucose, Bld: 79 mg/dL (ref 70–99)
Potassium: 3.9 mEq/L (ref 3.5–5.1)

## 2012-02-02 ENCOUNTER — Other Ambulatory Visit (HOSPITAL_COMMUNITY): Payer: Self-pay

## 2012-02-02 LAB — BASIC METABOLIC PANEL
BUN: 41 mg/dL — ABNORMAL HIGH (ref 6–23)
CO2: >45 mEq/L (ref 19–32)
Chloride: 79 mEq/L — ABNORMAL LOW (ref 96–112)
Creatinine, Ser: 0.36 mg/dL — ABNORMAL LOW (ref 0.50–1.35)
Glucose, Bld: 66 mg/dL — ABNORMAL LOW (ref 70–99)
Potassium: 3.9 mEq/L (ref 3.5–5.1)

## 2012-02-03 ENCOUNTER — Other Ambulatory Visit (HOSPITAL_COMMUNITY): Payer: Self-pay

## 2012-02-03 DIAGNOSIS — R932 Abnormal findings on diagnostic imaging of liver and biliary tract: Secondary | ICD-10-CM

## 2012-02-03 LAB — BASIC METABOLIC PANEL
BUN: 43 mg/dL — ABNORMAL HIGH (ref 6–23)
CO2: >45 mEq/L (ref 19–32)
Chloride: 78 mEq/L — ABNORMAL LOW (ref 96–112)
Creatinine, Ser: 0.4 mg/dL — ABNORMAL LOW (ref 0.50–1.35)
Glucose, Bld: 196 mg/dL — ABNORMAL HIGH (ref 70–99)

## 2012-02-03 LAB — MAGNESIUM: Magnesium: 2 mg/dL (ref 1.5–2.5)

## 2012-02-03 NOTE — Progress Notes (Signed)
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  [place] PCCM Progress Note  History of Present Illness: 71 yo wm with hx of copd and tobacco abuse. He was tx for pna at [facility] and also suffered PEA arrest. He was at [facility] x 1 month. Transferred to [facility] 4/4 with tracheostomy, peg for rehabilitation and liberation from mechanical ventilatory support.   Lines / Drains: Trach>> Rt picc>> Peg>>  Cultures: Sputum 4/9: MODERATE PSEUDOMONAS AERUGINOSA (pansens) 4/9 urine>>> pseudomonas  4/20 sputum>>>> pseudomonas ( increased res pattern)  Antibiotics (per ID): Imipenem 4/20>>>off 14 days Gent 4/20>>>off tobi nebs 4/24>>>off  Subjective: Tolerating TC  Stage 2  Physical Examination: Vitals reviewed in bedside chart.  General:  No distress on TC Neuro: Follows simple commands HEENT: Trach site clean Cardiovascular: S1S2 regular Lungs: Decreased breath sounds Lt base, CTa rt Abdomen: Soft, PEG in place Musculoskeletal: minimal leg edema     Labs and Imaging:    Lab 02/03/12 0640 02/02/12 0755 02/01/12 0640  NA 134* 134* 133*  K 3.9 3.9 3.9  CL 78* 79* 77*  CO2 >45* >45* >45*  BUN 43* 41* 38*  CREATININE 0.40* 0.36* 0.35*  GLUCOSE 196* 66* 79    Lab 01/31/12 0500 01/29/12 0402  HGB 10.5* 9.2*  HCT 33.9* 29.8*  WBC 8.1 7.9  PLT 345 290    Dg Chest Port 1 View  02/03/2012  *RADIOLOGY REPORT*  Clinical Data: Pneumonia  PORTABLE CHEST - 1 VIEW  Comparison:   the previous day's study  Findings: Tracheostomy and left arm PICC line in place.  Little change in patchy airspace opacities at the left lung base.  The left lateral costophrenic angle is excluded.  There are attenuated bronchovascular markings in both lung apices as before.  Heart size upper limits normal.  IMPRESSION:  1.  Little  change from previous day's portable exam  Original Report Authenticated By: Osa Craver, M.D.   Dg Chest Port 1 View  02/02/2012  *RADIOLOGY REPORT*  Clinical Data: Hypoxia  PORTABLE CHEST - 1 VIEW  Comparison: 01/30/2012  Findings: Cardiomediastinal silhouette is stable.  Stable tracheostomy tube position.  Left PICC line is stable in position. Stable small left pleural effusion with left basilar patchy atelectasis or infiltrate.  Minimal right basilar atelectasis stable.  No pulmonary edema.  IMPRESSION: Tracheostomy tube and left arm PICC line are stable in position.  Again noted small left pleural effusion with patchy left basilar atelectasis or infiltrate.  Stable right basilar atelectasis.  No pulmonary edema.  Original Report Authenticated By: Natasha Mead, M.D.     Assessment and Plan:  VDRF s/p trach r/t PNA with underlying COPD and FTT c/b pseudomonas tracheobronchitis--   S/p FOB for LLL ATX/collapse 4/24, and required repeat FOB 4/30 for hemoptysis.  CT chest 01/22/12>>severe paraseptal and centrilobular emphysema with extensive subpleural blebs and bulla, consolidation/collapse Lt upper and lower lobes, moderate Lt effusion, multiple nodules up to 1 cm  PLAN -  -Now is on TC 4-6 hrs, with 40-60%.  Would have goal 6 hrs trach collar, if needed can escalate O2 to as high as 70% as long as WOB not an issue The pcxr still witgh residual LLL, in past had collapr there with this appearance./ -follow clinically -would like to avoid vent time with pseudo , VAP risk , hence, would tolerate higher fio2 needs if neeeded to meet TC goals -Abx per ID -scheduled bronchodilators -bronchial hygiene  Agree with consideration for vent SNF placement.  Mcarthur Rossetti. Tyson Alias, MD, FACP Pgr: [phone] [facility]

## 2012-02-04 LAB — BASIC METABOLIC PANEL
BUN: 44 mg/dL — ABNORMAL HIGH (ref 6–23)
CO2: >45 mEq/L (ref 19–32)
Chloride: 81 mEq/L — ABNORMAL LOW (ref 96–112)
Creatinine, Ser: 0.37 mg/dL — ABNORMAL LOW (ref 0.50–1.35)
Glucose, Bld: 129 mg/dL — ABNORMAL HIGH (ref 70–99)

## 2012-02-06 ENCOUNTER — Other Ambulatory Visit (HOSPITAL_COMMUNITY): Payer: Self-pay

## 2012-02-06 LAB — BASIC METABOLIC PANEL
BUN: 45 mg/dL — ABNORMAL HIGH (ref 6–23)
CO2: >45 mEq/L (ref 19–32)
Calcium: 10.2 mg/dL (ref 8.4–10.5)
Glucose, Bld: 120 mg/dL — ABNORMAL HIGH (ref 70–99)
Sodium: 139 mEq/L (ref 135–145)

## 2012-02-06 LAB — CBC
Hemoglobin: 12.2 g/dL — ABNORMAL LOW (ref 13.0–17.0)
MCH: 29.3 pg (ref 26.0–34.0)
MCV: 95.2 fL (ref 78.0–100.0)
Platelets: 321 10*3/uL (ref 150–400)
RBC: 4.16 MIL/uL — ABNORMAL LOW (ref 4.22–5.81)

## 2012-02-07 LAB — BASIC METABOLIC PANEL
BUN: 65 mg/dL — ABNORMAL HIGH (ref 6–23)
CO2: >45 mEq/L (ref 19–32)
Chloride: 87 mEq/L — ABNORMAL LOW (ref 96–112)
GFR calc non Af Amer: >90 mL/min (ref >90)
Glucose, Bld: 156 mg/dL — ABNORMAL HIGH (ref 70–99)
Potassium: 3.6 mEq/L (ref 3.5–5.1)
Sodium: 139 mEq/L (ref 135–145)

## 2012-02-07 LAB — CBC
HCT: 35.4 % — ABNORMAL LOW (ref 39.0–52.0)
MCHC: 30.8 g/dL (ref 30.0–36.0)
MCV: 95.2 fL (ref 78.0–100.0)
RDW: 17.8 % — ABNORMAL HIGH (ref 11.5–15.5)

## 2012-02-07 NOTE — Progress Notes (Addendum)
                                                                    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  [place] PCCM Progress Note  History of Present Illness: 71 yo wm with hx of copd and tobacco abuse. He was tx for pna at [facility] and also suffered PEA arrest. He was at [facility] x 1 month. Transferred to [facility] 4/4 with tracheostomy, peg for rehabilitation and liberation from mechanical ventilatory support.   Lines / Drains: Trach>> Rt picc>> Peg>>  Cultures: Sputum 4/9: MODERATE PSEUDOMONAS AERUGINOSA (pansens) 4/9 urine>>> pseudomonas  4/20 sputum>>>> pseudomonas ( increased res pattern) 5/17>>>  Antibiotics (per ID): Imipenem 4/20>>>off 14 days Gent 4/20>>>off tobi nebs 4/24>>>off  Subjective: Tolerating TC  Stage 2  Physical Examination: Vitals reviewed in bedside chart.  General: no distress but currently on full vent support w/ increased secretions.  Neuro: Follows simple commands HEENT: Trach site clean Cardiovascular: S1S2 regular Lungs: Decreased breath sounds Lt base, CTa rt Abdomen: Soft, PEG in place Musculoskeletal: minimal leg edema     Labs and Imaging:    Lab 02/06/12 0710 02/04/12 0500 02/03/12 0640  NA 139 136 134*  K 3.4* 4.0 3.9  CL 82* 81* 78*  CO2 >45* >45* >45*  BUN 45* 44* 43*  CREATININE 0.42* 0.37* 0.40*  GLUCOSE 120* 129* 196*    Lab 02/07/12 0446 02/06/12 0710  HGB 10.9* 12.2*  HCT 35.4* 39.6  WBC 11.8* 13.8*  PLT 319 321    Dg Chest Port 1 View  02/06/2012  *RADIOLOGY REPORT*  Clinical Data: Shortness of breath, difficulty breathing.  PORTABLE CHEST - 1 VIEW  Comparison: 02/03/2012  Findings: Consolidation in the left lower lobe compatible with pneumonia.  Small left effusion.  Underlying hyperinflation/COPD. Tracheostomy and left PICC line unchanged.  Heart is borderline in size.  IMPRESSION: No  significant change left lower lung consolidation and small left effusion.  COPD.  Original Report Authenticated By: Cyndie Chime, M.D.     Assessment and Plan:  VDRF s/p trach r/t PNA with underlying COPD and FTT c/b pseudomonas tracheobronchitis/VAP S/p FOB for LLL ATX/collapse 4/24, and required repeat FOB 4/30 for hemoptysis.  CT chest 01/22/12>>severe paraseptal and centrilobular emphysema with extensive subpleural blebs and bulla, consolidation/collapse Lt upper and lower lobes, moderate Lt effusion, multiple nodules up to 1 cm PLAN -  -cont to work on weaning: LLL residual injury is the issue why he has not progressed fully off vent. And his COPD extensive -re-culture -scheduled bronchodilators -bronchial hygiene -daytime trach collar as tolerated, would still recom,end full support at night given his variable success daily with TC  Agree has received adequate over 2 weeks ABX, monitor for now  Mcarthur Rossetti. Tyson Alias, MD, FACP Pgr: [phone] [facility]

## 2012-02-10 ENCOUNTER — Other Ambulatory Visit (HOSPITAL_COMMUNITY): Payer: Self-pay

## 2012-02-10 LAB — BASIC METABOLIC PANEL
CO2: >45 mEq/L (ref 19–32)
Calcium: 9.4 mg/dL (ref 8.4–10.5)
Creatinine, Ser: 0.43 mg/dL — ABNORMAL LOW (ref 0.50–1.35)
GFR calc non Af Amer: >90 mL/min (ref >90)

## 2012-02-10 LAB — CBC
MCH: 29.2 pg (ref 26.0–34.0)
MCV: 96.2 fL (ref 78.0–100.0)
Platelets: 291 10*3/uL (ref 150–400)
RDW: 17.5 % — ABNORMAL HIGH (ref 11.5–15.5)
WBC: 10 10*3/uL (ref 4.0–10.5)

## 2012-02-10 LAB — CULTURE, RESPIRATORY W GRAM STAIN

## 2012-02-10 NOTE — Progress Notes (Signed)
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  PULMONARY / CRITICAL CARE MEDICINE  History of Present Illness: 71 yo wm with hx of copd and tobacco abuse. He was tx for pna at [facility] and also suffered PEA arrest. He was at [facility] x 1 month. Transferred to [facility] 4/4 with tracheostomy, peg for rehabilitation and liberation from mechanical ventilatory support.   Lines / Drains: Trach>> Rt picc>> Peg>>  Cultures: Sputum 4/9: MODERATE PSEUDOMONAS AERUGINOSA (pansens) 4/9 urine>>> pseudomonas  4/20 sputum>>>> pseudomonas ( increased res pattern) 5/17>>>Mod pseudomonas (sensitivities pending)>>>  Antibiotics (per ID): Imipenem 4/20>>>off 14 days Gent 4/20>>>off tobi nebs 4/24>>>off  Subjective: Tolerating TC  Stage 2  Physical Examination: Vitals reviewed in bedside chart, sats 96% on 60% ATC  General: no distress but still on high FIFO2 Neuro: Follows simple commands HEENT: Trach site clean Cardiovascular: S1S2 regular Lungs: Decreased breath sounds Lt base, CTa rt Abdomen: Soft, PEG in place Musculoskeletal: minimal leg edema    Labs and Imaging:    Lab 02/10/12 0506 02/07/12 0920 02/06/12 0710  NA 142 139 139  K 3.6 3.6 3.4*  CL 90* 87* 82*  CO2 >45* >45* >45*  BUN 42* 65* 45*  CREATININE 0.43* 0.52 0.42*  GLUCOSE 187* 156* 120*    Lab 02/10/12 0506 02/07/12 0446 02/06/12 0710  HGB 9.9* 10.9* 12.2*  HCT 32.6* 35.4* 39.6  WBC 10.0 11.8* 13.8*  PLT 291 319 321    Dg Chest Port 1 View  02/10/2012  *RADIOLOGY REPORT*  Clinical Data: Hypoxia, cough, shortness of breath  PORTABLE CHEST - 1 VIEW  Comparison: 02/06/2012; 02/03/2012; 02/02/2012; 01/27/2012  Findings: Grossly unchanged enlarged cardiac silhouette and mediastinal contours.  Stable position of support apparatus. No  definite interval change and small left-sided pleural effusion and left basilar heterogeneous / consolidative opacities given differences in patient positioning. Minimal increase in right infrahilar heterogeneous opacities.  The lungs remain hyperinflated.  No definite pneumothorax.  Grossly unchanged bones.  IMPRESSION: 1.  Stable positioning of support apparatus.  No pneumothorax. 2.  Grossly unchanged small left-sided effusion and left basilar heterogeneous / consolidative opacities given differences in patient positioning. 3.  Slight worsening of right basilar opacities, possibly atelectasis.  Original Report Authenticated By: Waynard Reeds, M.D.   CT chest 01/22/12>>severe paraseptal and centrilobular emphysema with extensive subpleural blebs and bulla, consolidation/collapse Lt upper and lower lobes, moderate Lt effusion, multiple nodules up to 1 cm  Assessment and Plan:  VDRF s/p trach r/t PNA with underlying COPD and FTT c/b pseudomonas tracheobronchitis/VAP. S/p FOB for LLL ATX/collapse 4/24, and required repeat FOB 4/30 for hemoptysis. Now off vent intermittently but requiring high FIO2. His repeat sputum shows persistent pseudomonas (this may be colonization)  PLAN -  -would have low threshold for abx if clinically declines.  -scheduled bronchodilators -bronchial hygiene -daytime trach collar as tolerated, would still recommend full support at night given his variable success daily with TC   Anders Simmonds ACNP-BC [facility] Pulmonary/Critical Care Pager # [phone] OR # [phone]  if no answer  Patient examined.  Records reviewed.  Case discussed with ACNP.  Assessment and plan as above.  Orlean Bradford, M.D., F.C.C.P. Pulmonary and Critical Care Medicine [facility] Cell: [phone] Pager: [phone]

## 2012-02-11 LAB — CANCER ANTIGEN 19-9: CA 19-9: 86 U/mL — ABNORMAL HIGH (ref <35.0)

## 2012-02-12 LAB — CBC
MCH: 28.7 pg (ref 26.0–34.0)
MCHC: 31.1 g/dL (ref 30.0–36.0)
Platelets: 294 10*3/uL (ref 150–400)
RBC: 3.41 MIL/uL — ABNORMAL LOW (ref 4.22–5.81)

## 2012-02-12 LAB — BASIC METABOLIC PANEL
Calcium: 9.3 mg/dL (ref 8.4–10.5)
Sodium: 132 mEq/L — ABNORMAL LOW (ref 135–145)

## 2012-02-12 NOTE — Progress Notes (Signed)
                                                                    Name: Jeremy Vance MRN: [id number] DOB: 31-Jul-1941    LOS: 48 Requesting MD:  Arelia Longest Regarding:  VDRF  PULMONARY / CRITICAL CARE MEDICINE  History of Present Illness: 71 yo with COPD and pneumonia complicated by PEA arrest  Interval history:  No acute events since seen last.  Vital signs:  Reviewed.  Physical Examination: General: Comfortable, no distress Neuro: Follows some commands HEENT: Tracheostomy in place, wound clean Cardiovascular: Regular heart, no murmurs Lungs: Bilateral diminished air entry Abdomen: Soft, nonditended Musculoskeletal: Trace edema  Labs:   Lab 02/12/12 0615 02/10/12 0506 02/07/12 0920  NA 132* 142 139  K 3.4* 3.6 3.6  CL 84* 90* 87*  CO2 42* >45* >45*  BUN 37* 42* 65*  CREATININE 0.39* 0.43* 0.52  GLUCOSE 136* 187* 156*    Lab 02/12/12 0615 02/10/12 0506 02/07/12 0446  HGB 9.8* 9.9* 10.9*  HCT 31.5* 32.6* 35.4*  WBC 9.8 10.0 11.8*  PLT 294 291 319   Imaging:  No new  ASSESSMENT AND PLAN:  Acute on chronic respiratory failure Status post tracheostomy COPD Pneumonia, resolved Pseudomonal colonization  - Full ventilatory support - Wean as tolerated - Bronchodilators - Pulmonary hygiene - Monitor off Abx  Orlean Bradford, M.D., F.C.C.P. Pulmonary and Critical Care Medicine [facility] Cell: [phone] Pager: [phone]

## 2012-02-14 NOTE — Progress Notes (Signed)
                                                                    Name: Jeremy Vance MRN: [id number] DOB: May 31, 1941    LOS: 50 Requesting MD:  Arelia Longest Regarding:  VDRF  PULMONARY / CRITICAL CARE MEDICINE  History of Present Illness: 71 yo with COPD and pneumonia complicated by PEA arrest  Interval history:  No acute events since seen last.Looks comfortable on ATC  Vital signs:  Reviewed.  Physical Examination: General: Comfortable, no distress Neuro: Follows some commands HEENT: Tracheostomy in place, wound clean Cardiovascular: Regular heart, no murmurs Lungs: Bilateral diminished air entry Abdomen: Soft, nonditended Musculoskeletal: Trace edema  Labs:   Lab 02/12/12 0615 02/10/12 0506  NA 132* 142  K 3.4* 3.6  CL 84* 90*  CO2 42* >45*  BUN 37* 42*  CREATININE 0.39* 0.43*  GLUCOSE 136* 187*    Lab 02/12/12 0615 02/10/12 0506  HGB 9.8* 9.9*  HCT 31.5* 32.6*  WBC 9.8 10.0  PLT 294 291   Imaging:  No new  ASSESSMENT AND PLAN:  Acute on chronic respiratory failure Status post tracheostomy COPD Pneumonia, resolved Pseudomonal colonization  - Full ventilatory support at HS, ATC during day - Wean as tolerated - Bronchodilators - Pulmonary hygiene - Monitor off Abx  PB  Patient examined.  Records reviewed.  Case discussed with NP.  Assessment and plan as above.  Orlean Bradford, M.D., F.C.C.P. Pulmonary and Critical Care Medicine [facility] Cell: [phone] Pager: [phone]

## 2012-02-15 LAB — BASIC METABOLIC PANEL
BUN: 24 mg/dL — ABNORMAL HIGH (ref 6–23)
Creatinine, Ser: 0.27 mg/dL — ABNORMAL LOW (ref 0.50–1.35)
GFR calc Af Amer: >90 mL/min (ref >90)
GFR calc non Af Amer: >90 mL/min (ref >90)
Glucose, Bld: 90 mg/dL (ref 70–99)

## 2012-02-15 LAB — CBC
HCT: 31.4 % — ABNORMAL LOW (ref 39.0–52.0)
Hemoglobin: 10.1 g/dL — ABNORMAL LOW (ref 13.0–17.0)
MCH: 29.4 pg (ref 26.0–34.0)
MCHC: 32.2 g/dL (ref 30.0–36.0)
MCV: 91.5 fL (ref 78.0–100.0)
RDW: 16.9 % — ABNORMAL HIGH (ref 11.5–15.5)

## 2012-02-16 LAB — CBC
MCH: 28.5 pg (ref 26.0–34.0)
MCHC: 30.8 g/dL (ref 30.0–36.0)
RDW: 16.7 % — ABNORMAL HIGH (ref 11.5–15.5)

## 2012-02-16 LAB — BASIC METABOLIC PANEL
BUN: 24 mg/dL — ABNORMAL HIGH (ref 6–23)
Calcium: 8.9 mg/dL (ref 8.4–10.5)
GFR calc Af Amer: >90 mL/min (ref >90)
GFR calc non Af Amer: >90 mL/min (ref >90)
Glucose, Bld: 117 mg/dL — ABNORMAL HIGH (ref 70–99)
Potassium: 3.4 mEq/L — ABNORMAL LOW (ref 3.5–5.1)
Sodium: 135 mEq/L (ref 135–145)

## 2012-02-17 LAB — PREALBUMIN: Prealbumin: 23.4 mg/dL (ref 17.0–34.0)

## 2012-02-18 NOTE — Progress Notes (Signed)
                                                                    Name: Jeremy Vance MRN: [id number] DOB: [birth year]    LOS: 54 Requesting MD:  Arelia Longest Regarding:  VDRF  PULMONARY / CRITICAL CARE MEDICINE  History of Present Illness: 71 yo with COPD and pneumonia complicated by PEA arrest  Interval history:  No acute events since seen last.Looks comfortable on ATC  Vital signs:  Reviewed.  Physical Examination: General: Comfortable, no distress, on t collar in chair Neuro: Follows some commands HEENT: Tracheostomy in place, wound clean Cardiovascular: Regular heart, no murmurs Lungs: Bilateral diminished air entry Abdomen: Soft, nonditended Musculoskeletal: Trace edema  Labs:   Lab 02/17/12 0500 02/16/12 0500 02/15/12 0515 02/12/12 0615  NA -- 135 134* 132*  K 3.9 3.4* 4.0 --  CL -- 88* 87* 84*  CO2 -- 45* 42* 42*  BUN -- 24* 24* 37*  CREATININE -- 0.32* 0.27* 0.39*  GLUCOSE -- 117* 90 136*    Lab 02/16/12 0500 02/15/12 0515 02/12/12 0615  HGB 10.1* 10.1* 9.8*  HCT 32.8* 31.4* 31.5*  WBC 10.9* 9.3 9.8  PLT 292 344 294   Imaging:   No results found.   ASSESSMENT AND PLAN:  Acute on chronic respiratory failure Status post tracheostomy COPD Pneumonia, resolved Pseudomonal colonization  - Full ventilatory support at HS, ATC during day - Wean as tolerated - Bronchodilators - Pulmonary hygiene - Monitor off Abx  Brett Canales Minor ACNP [facility] Pager [phone] till 3 pm If no answer page [phone] 02/18/2012, 10:57 AM  ATC during the day then full vent at night, will need vent SNF.  Patient seen and examined, agree with above note.  I dictated the care and orders written for this patient under my direction.  Koren Bound, M.D. [phone]

## 2012-02-19 ENCOUNTER — Other Ambulatory Visit (HOSPITAL_COMMUNITY): Payer: Self-pay

## 2012-02-19 LAB — BASIC METABOLIC PANEL
CO2: 42 mEq/L (ref 19–32)
Calcium: 9.4 mg/dL (ref 8.4–10.5)
Creatinine, Ser: 0.35 mg/dL — ABNORMAL LOW (ref 0.50–1.35)
GFR calc non Af Amer: >90 mL/min (ref >90)
Glucose, Bld: 195 mg/dL — ABNORMAL HIGH (ref 70–99)
Sodium: 136 mEq/L (ref 135–145)

## 2012-02-19 LAB — CBC
MCH: 28.6 pg (ref 26.0–34.0)
MCV: 93 fL (ref 78.0–100.0)
Platelets: 280 10*3/uL (ref 150–400)
RBC: 3.7 MIL/uL — ABNORMAL LOW (ref 4.22–5.81)
RDW: 17 % — ABNORMAL HIGH (ref 11.5–15.5)

## 2012-02-19 LAB — MAGNESIUM: Magnesium: 1.7 mg/dL (ref 1.5–2.5)

## 2012-02-19 NOTE — Progress Notes (Signed)
                                                                    Name: Cris Talavera MRN: [id number] DOB: 11-19-1940    LOS: 55 Requesting MD:  Arelia Longest Regarding:  VDRF  PULMONARY / CRITICAL CARE MEDICINE  History of Present Illness: 71 yo with COPD and pneumonia complicated by PEA arrest  Interval history:  No acute events since seen last.Looks comfortable on ATC  Vital signs:  Reviewed.  Physical Examination: General: Comfortable, no distress, on t collar in chair Neuro: Follows some commands HEENT: Tracheostomy in place, wound clean Cardiovascular: Regular heart, no murmurs Lungs: Bilateral diminished air entry Abdomen: Soft, nonditended Musculoskeletal: Trace edema  Labs:   Lab 02/19/12 0600 02/17/12 0500 02/16/12 0500 02/15/12 0515  NA 136 -- 135 134*  K 4.4 3.9 3.4* --  CL 88* -- 88* 87*  CO2 42* -- 45* 42*  BUN 27* -- 24* 24*  CREATININE 0.35* -- 0.32* 0.27*  GLUCOSE 195* -- 117* 90    Lab 02/19/12 0600 02/16/12 0500 02/15/12 0515  HGB 10.6* 10.1* 10.1*  HCT 34.4* 32.8* 31.4*  WBC 13.3* 10.9* 9.3  PLT 280 292 344   Imaging:   Dg Chest Port 1 View  02/19/2012  *RADIOLOGY REPORT*  Clinical Data: Hypoxia  PORTABLE CHEST - 1 VIEW  Comparison: 02/10/2012  Findings: Left PICC and tracheostomy tube are stable in position. Left perihilar and basilar airspace disease associated with a left pleural effusion is stable.  Right basilar atelectasis improved. No pneumothorax.  IMPRESSION: Improved right basilar atelectasis.  Stable left airspace disease and basilar effusion.  Original Report Authenticated By: Donavan Burnet, M.D.     ASSESSMENT AND PLAN:  Acute on chronic respiratory failure Status post tracheostomy COPD Pneumonia, resolved Pseudomonal colonization  - Full ventilatory support at HS, ATC during day - Wean as tolerated - Bronchodilators - Pulmonary hygiene - Monitor off Abx   Cont ATC during the day then full vent at  night, will need vent SNF.   Billy Fischer, M.D. [phone]

## 2012-02-20 ENCOUNTER — Other Ambulatory Visit (HOSPITAL_COMMUNITY): Payer: Self-pay

## 2012-02-20 LAB — COMPREHENSIVE METABOLIC PANEL
AST: 14 U/L (ref 0–37)
Albumin: 2.7 g/dL — ABNORMAL LOW (ref 3.5–5.2)
CO2: 41 mEq/L (ref 19–32)
Calcium: 9.5 mg/dL (ref 8.4–10.5)
Creatinine, Ser: 0.45 mg/dL — ABNORMAL LOW (ref 0.50–1.35)
GFR calc non Af Amer: >90 mL/min (ref >90)

## 2012-02-20 LAB — BODY FLUID CELL COUNT WITH DIFFERENTIAL
Neutrophil Count, Fluid: 37 % — ABNORMAL HIGH (ref 0–25)
Total Nucleated Cell Count, Fluid: 2014 cu mm — ABNORMAL HIGH (ref 0–1000)

## 2012-02-20 LAB — PROTEIN, BODY FLUID: Total protein, fluid: 4.1 g/dL

## 2012-02-20 LAB — PROTIME-INR
INR: 1.04 (ref 0.00–1.49)
Prothrombin Time: 13.8 seconds (ref 11.6–15.2)

## 2012-02-20 LAB — GLUCOSE, SEROUS FLUID: Glucose, Fluid: 204 mg/dL

## 2012-02-20 NOTE — Procedures (Signed)
US guided diagnostic/therapeutic left thoracentesis performed yielding 770 cc's slightly turbid, amber fluid. The fluid was sent to the lab for preordered studies. No immediate complications. F/u CXR pending.

## 2012-02-21 ENCOUNTER — Other Ambulatory Visit (HOSPITAL_COMMUNITY): Payer: Self-pay

## 2012-02-21 DIAGNOSIS — J9383 Other pneumothorax: Secondary | ICD-10-CM

## 2012-02-21 DIAGNOSIS — J939 Pneumothorax, unspecified: Secondary | ICD-10-CM

## 2012-02-21 LAB — BASIC METABOLIC PANEL
BUN: 53 mg/dL — ABNORMAL HIGH (ref 6–23)
Calcium: 9.5 mg/dL (ref 8.4–10.5)
GFR calc Af Amer: >90 mL/min (ref >90)
GFR calc non Af Amer: >90 mL/min (ref >90)
Potassium: 3.9 mEq/L (ref 3.5–5.1)

## 2012-02-21 LAB — CBC
Hemoglobin: 10.9 g/dL — ABNORMAL LOW (ref 13.0–17.0)
MCHC: 31.7 g/dL (ref 30.0–36.0)
Platelets: 271 10*3/uL (ref 150–400)
RDW: 17.6 % — ABNORMAL HIGH (ref 11.5–15.5)

## 2012-02-21 LAB — EXPECTORATED SPUTUM ASSESSMENT W GRAM STAIN, RFLX TO RESP C

## 2012-02-21 MED ORDER — FENTANYL CITRATE 0.05 MG/ML IJ SOLN
INTRAMUSCULAR | Status: AC | PRN
  Administered 2012-02-21: 25 ug via INTRAVENOUS

## 2012-02-21 MED ORDER — MIDAZOLAM HCL 5 MG/5ML IJ SOLN
INTRAMUSCULAR | Status: AC | PRN
  Administered 2012-02-21: 1 mg via INTRAVENOUS

## 2012-02-21 NOTE — Procedures (Signed)
82F Left chest tube placed under fluoro No complication No blood loss. See complete dictation in [facility].

## 2012-02-21 NOTE — Progress Notes (Signed)
Subjective: Pt denies any acute changes in breathing status; wife in room  Objective: Vital signs in last 24 hours: Pulse Rate:  [64-69] 64  (05/31 0150) Resp:  [12-14] 14  (05/31 0130) BP: (95-122)/(56-70) 107/62 mmHg (05/31 0150) SpO2:  [96 %-99 %] 96 % (05/31 0150)    Intake/Output from previous day:   Intake/Output this shift:    Left chest tube intact, no visible air leak, about 200 cc's amber fluid in pleuravac, tube connected to wall suction, pleural fl cx's pend  Lab Results:   Basename 02/21/12 0636 02/19/12 0600  WBC 15.4* 13.3*  HGB 10.9* 10.6*  HCT 34.4* 34.4*  PLT 271 280   BMET  Basename 02/21/12 0636 02/20/12 1302  NA 136 134*  K 3.9 3.6  CL 86* 83*  CO2 42* 41*  GLUCOSE 199* 206*  BUN 53* 41*  CREATININE 0.49* 0.45*  CALCIUM 9.5 9.5   PT/INR  Basename 02/20/12 1302  LABPROT 13.8  INR 1.04   ABG No results found for this basename: PHART:2,PCO2:2,PO2:2,HCO3:2 in the last 72 hours  Studies/Results: Ct Chest Wo Contrast  02/20/2012  *RADIOLOGY REPORT*  Clinical Data: Shortness of breath.  Left pneumothorax after thoracentesis.  CT CHEST WITHOUT CONTRAST  Technique:  Multidetector CT imaging of the chest was performed following the standard protocol without IV contrast.  Comparison: CT chest 01/22/2012, chest radiograph 02/20/2012  Findings: Tracheostomy.  Left central venous catheter with tip in the SVC.  Normal heart size.  Coronary artery calcification. Normal caliber thoracic aorta with calcification.  Borderline prominent left paratracheal lymph node at 10 mm short axis dimension.  Additional mediastinal lymph nodes are not pathologically enlarged.  The esophagus is decompressed.  There is a left hydropneumothorax, as shown on today's previous chest x-ray.  There is atelectasis and infiltration in the left lower lung.  Left hilar mass is not excluded.  Extensive emphysematous changes and scarring in both lungs with prominent bulla bilaterally.  4  mm nodules in the right middle lung appears stable since the previous study.  Airways appear patent. Degenerative changes in the thoracic spine.  Images obtained of the upper abdomen demonstrate gastrostomy tube. There is extensive mass or lymphadenopathy in the gastrohepatic ligament and celiac axis region.  Multiple lymph nodes measuring up to 3 cm diameter are appreciated. These were present on the previous study.  IMPRESSION: Moderate left hydropneumothorax as shown on previous chest radiograph.  Atelectasis and consolidation in the left lung.  Left perihilar mass is not excluded.  Borderline prominent mediastinal lymph nodes.  Prominent diffuse bilateral emphysema.  Multiple enlarged lymph nodes versus masses in the gastrohepatic ligament and celiac axis region of the abdomen.  Original Report Authenticated By: Marlon Pel, M.D.   Dg Chest Portable 1 View  02/21/2012  *RADIOLOGY REPORT*  Clinical Data: Chest tube placed and hypoxia.  PORTABLE CHEST - 1 VIEW  Comparison: Chest radiographs 02/20/2012 and 02/10/2012.  Findings: Tracheostomy tube is stable.  Left upper extremity PICC remains in the superior vena cava.  Left pleural drainage catheter projects over the left lung base.  No visible pneumothorax.  There is airspace disease with consolidation in the left lung base and left perihilar region.  The right lung is clear.  Severe emphysematous changes are present.  IMPRESSION:  1.  Left basilar and perihilar airspace disease with consolidation. 2.  Left pleural drainage catheter in place.  No visible pneumothorax. 3.  Severe emphysema.  Original Report Authenticated By: Britta Mccreedy, M.D.  Dg Chest Portable 1 View  02/20/2012  *RADIOLOGY REPORT*  Clinical Data: Thoracentesis.  PORTABLE CHEST - 1 VIEW  Comparison: Chest 02/20/2012 at 7:16 a.m.  Findings: The patient has a left pneumothorax estimated at 30-40% after thoracentesis.  Left pleural effusion is markedly decreased in size with basilar  airspace disease seen.  Right basilar atelectasis noted. Lungs are emphysematous.  Heart size normal.  IMPRESSION: Left pneumothorax after thoracentesis.  Critical Value/emergent results were called by telephone at the time of interpretation on 02/20/2012  at 7:40 p.m.  to  Crystal, the patient's nurse, who verbally acknowledged these results.  Original Report Authenticated By: Bernadene Bell. Maricela Curet, M.D.   Dg Chest Port 1 View  02/20/2012  *RADIOLOGY REPORT*  Clinical Data: Left base infiltrate and effusion.  PORTABLE CHEST - 1 VIEW  Comparison: 05/29 and 02/10/2012  Findings: Tracheostomy and PICC in place.  PICC tip is in good position in the superior vena cava.  The left effusion has increased with decreased aeration in the left lung.  Large blebs are seen in the remaining aerated portion of the left lung.  Severe emphysematous changes are noted on the right.  Pulmonary vascularity has improved on the right.  Minimal atelectasis or scarring at the right base.  IMPRESSION: Increasing left base effusion and atelectasis / consolidation.  Original Report Authenticated By: Gwynn Burly, M.D.   Ir Perc Pleural Drain W/indwell Cath W/img Guide  02/21/2012  *RADIOLOGY REPORT*  Clinical data:  Hydropneumothorax post thoracentesis  LEFT CHEST TUBE PLACEMENT UNDER FLUOROSCOPY  Technique and findings: The procedure, risks (including but not limited to bleeding, infection, organ damage), benefits, and alternatives were explained to the patient.  Questions regarding the procedure were encouraged and answered.  The patient understands and consents to the procedure.The left lateral hemithorax was prepped with Betadine, draped in usual sterile fashion. Maximal barrier sterile technique was utilized including caps, mask, sterile gowns, sterile gloves, sterile drape, hand hygiene and skin antiseptic.  Intravenous Fentanyl and Versed were administered as conscious sedation during continuous cardiorespiratory monitoring by  the radiology RN, with a total moderate sedation time of 10 minutes.  Under intermittent fluoroscopic guidance, a 19 gauge percutaneous entry needle was advanced into the left pleural space.  Needle was exchanged over guide wire for serial dilators which allowed placement of 14-French pigtail catheter, formed within the posterolateral aspect of the pleural space.  Catheter secured externally with O-Prolene suture and Statlock and placed to Pleur- Evac drainage system. The patient tolerated the procedure well.  No immediate complication.  IMPRESSION: Technically successful left chest tube placement under fluoroscopy  Original Report Authenticated By: Osa Craver, M.D.    Anti-infectives: Anti-infectives    None      Assessment/Plan:  S/p left thoracentesis 5/30 with subsequent hydroptx/left chest tube placement; check fluid cx's/cytology; cont tube to wall suction, check am CXR  LOS: 57 days    Nashiya Disbrow,D [facility] 02/21/2012

## 2012-02-21 NOTE — Progress Notes (Signed)
Name: Jeremy Vance MRN: [id number] DOB: 1941-05-05    LOS: 57  PULMONARY / CRITICAL CARE MEDICINE  Asked by eLink physician to emergently evaluate [name] due to post-procedural pneumothorax on the left.  Patient examined.  Records reviewed.  71 yo with COPD, pneumonia and VDRF.  Underwent thoracentesis by IR for persistent left pleural effusion.  CXR performed after procedure demonstrated pneumothorax.  Vital signs stable.  SpO2 94% while breathing 35% oxygen spontaneously.  No distress. Breaths sounds diminished bilaterally (L>R). STAT chest CT ordered, demonstrated moderate left hydropneumothorax as shown on previous chest radiograph. Atelectasis and consolidation in the left lung. Left perihilar mass is not excluded. Borderline prominent mediastinal lymph nodes. Prominent diffuse bilateral emphysema. Multiple enlarged lymph nodes versus masses in the gastrohepatic ligament and celiac axis region of the abdomen.  Left pleural effusion status post thoracentesis Left pneumothorax ex vacuo Suspected lung entrapment / trapped lung Significant bilateral bullous emphysema  -->  No indication for emergent bedside tube thoracostomy as stable and at high risk of complications given significant bullous emphysema and complicated pleural space -->  Consider image guided pigtail catheter placement by IR -->  Consider thoracic surgery evaluation -->  Please reconsult if changes in clinical condition  Orlean Bradford, M.D., F.C.C.P. Pulmonary and Critical Care Medicine [facility] Cell: [phone] Pager: [phone]

## 2012-02-21 NOTE — Progress Notes (Signed)
Name: Jeremy Vance MRN: [id number] DOB: 28-Sep-1940    LOS: 57 Requesting MD:  Arelia Longest Regarding:  VDRF  PULMONARY / CRITICAL CARE MEDICINE  History of Present Illness: 71 yo with COPD and pneumonia complicated by PEA arrest  Interval history:   L Thoracentesis 5/30 >> exudate by chemistries L PTX 5/30 >> L pigtail placed by IR Back on vent support since 5/30 PM  Vital signs:  Reviewed.  Physical Examination: General: Comfortable, no distress on full vent support Neuro: Follows some commands HEENT: Tracheostomy in place, wound clean Cardiovascular: Regular heart, no murmurs Lungs: Bilateral diminished air entry Abdomen: Soft, nonditended Musculoskeletal: Trace edema  Labs:   Lab 02/21/12 0636 02/20/12 1302 02/19/12 0600  NA 136 134* 136  K 3.9 3.6 4.4  CL 86* 83* 88*  CO2 42* 41* 42*  BUN 53* 41* 27*  CREATININE 0.49* 0.45* 0.35*  GLUCOSE 199* 206* 195*    Lab 02/21/12 0636 02/19/12 0600 02/16/12 0500  HGB 10.9* 10.6* 10.1*  HCT 34.4* 34.4* 32.8*  WBC 15.4* 13.3* 10.9*  PLT 271 280 292   Imaging:   Ct Chest Wo Contrast  02/20/2012  *RADIOLOGY REPORT*  Clinical Data: Shortness of breath.  Left pneumothorax after thoracentesis.  CT CHEST WITHOUT CONTRAST  Technique:  Multidetector CT imaging of the chest was performed following the standard protocol without IV contrast.  Comparison: CT chest 01/22/2012, chest radiograph 02/20/2012  Findings: Tracheostomy.  Left central venous catheter with tip in the SVC.  Normal heart size.  Coronary artery calcification. Normal caliber thoracic aorta with calcification.  Borderline prominent left paratracheal lymph node at 10 mm short axis dimension.  Additional mediastinal lymph nodes are not pathologically enlarged.  The esophagus is decompressed.  There is a left hydropneumothorax, as shown on today's previous chest x-ray.  There is  atelectasis and infiltration in the left lower lung.  Left hilar mass is not excluded.  Extensive emphysematous changes and scarring in both lungs with prominent bulla bilaterally.  4 mm nodules in the right middle lung appears stable since the previous study.  Airways appear patent. Degenerative changes in the thoracic spine.  Images obtained of the upper abdomen demonstrate gastrostomy tube. There is extensive mass or lymphadenopathy in the gastrohepatic ligament and celiac axis region.  Multiple lymph nodes measuring up to 3 cm diameter are appreciated. These were present on the previous study.  IMPRESSION: Moderate left hydropneumothorax as shown on previous chest radiograph.  Atelectasis and consolidation in the left lung.  Left perihilar mass is not excluded.  Borderline prominent mediastinal lymph nodes.  Prominent diffuse bilateral emphysema.  Multiple enlarged lymph nodes versus masses in the gastrohepatic ligament and celiac axis region of the abdomen.  Original Report Authenticated By: Marlon Pel, M.D.   Dg Chest Portable 1 View  02/21/2012  *RADIOLOGY REPORT*  Clinical Data: Chest tube placed and hypoxia.  PORTABLE CHEST - 1 VIEW  Comparison: Chest radiographs 02/20/2012 and 02/10/2012.  Findings: Tracheostomy tube is stable.  Left upper extremity PICC remains in the superior vena cava.  Left pleural drainage catheter projects over the left lung base.  No visible pneumothorax.  There is airspace disease with consolidation in the left lung base and left perihilar region.  The right lung is clear.  Severe emphysematous changes are present.  IMPRESSION:  1.  Left basilar and perihilar airspace disease with consolidation. 2.  Left pleural drainage catheter in place.  No visible pneumothorax. 3.  Severe emphysema.  Original Report Authenticated By: Britta Mccreedy, M.D.   Dg Chest Portable 1 View  02/20/2012  *RADIOLOGY REPORT*  Clinical Data: Thoracentesis.  PORTABLE CHEST - 1 VIEW  Comparison:  Chest 02/20/2012 at 7:16 a.m.  Findings: The patient has a left pneumothorax estimated at 30-40% after thoracentesis.  Left pleural effusion is markedly decreased in size with basilar airspace disease seen.  Right basilar atelectasis noted. Lungs are emphysematous.  Heart size normal.  IMPRESSION: Left pneumothorax after thoracentesis.  Critical Value/emergent results were called by telephone at the time of interpretation on 02/20/2012  at 7:40 p.m.  to  Crystal, the patient's nurse, who verbally acknowledged these results.  Original Report Authenticated By: Bernadene Bell. Maricela Curet, M.D.   Dg Chest Port 1 View  02/20/2012  *RADIOLOGY REPORT*  Clinical Data: Left base infiltrate and effusion.  PORTABLE CHEST - 1 VIEW  Comparison: 05/29 and 02/10/2012  Findings: Tracheostomy and PICC in place.  PICC tip is in good position in the superior vena cava.  The left effusion has increased with decreased aeration in the left lung.  Large blebs are seen in the remaining aerated portion of the left lung.  Severe emphysematous changes are noted on the right.  Pulmonary vascularity has improved on the right.  Minimal atelectasis or scarring at the right base.  IMPRESSION: Increasing left base effusion and atelectasis / consolidation.  Original Report Authenticated By: Gwynn Burly, M.D.   Ir Perc Pleural Drain W/indwell Cath W/img Guide  02/21/2012  *RADIOLOGY REPORT*  Clinical data:  Hydropneumothorax post thoracentesis  LEFT CHEST TUBE PLACEMENT UNDER FLUOROSCOPY  Technique and findings: The procedure, risks (including but not limited to bleeding, infection, organ damage), benefits, and alternatives were explained to the patient.  Questions regarding the procedure were encouraged and answered.  The patient understands and consents to the procedure.The left lateral hemithorax was prepped with Betadine, draped in usual sterile fashion. Maximal barrier sterile technique was utilized including caps, mask, sterile gowns,  sterile gloves, sterile drape, hand hygiene and skin antiseptic.  Intravenous Fentanyl and Versed were administered as conscious sedation during continuous cardiorespiratory monitoring by the radiology RN, with a total moderate sedation time of 10 minutes.  Under intermittent fluoroscopic guidance, a 19 gauge percutaneous entry needle was advanced into the left pleural space.  Needle was exchanged over guide wire for serial dilators which allowed placement of 14-French pigtail catheter, formed within the posterolateral aspect of the pleural space.  Catheter secured externally with O-Prolene suture and Statlock and placed to Pleur- Evac drainage system. The patient tolerated the procedure well.  No immediate complication.  IMPRESSION: Technically successful left chest tube placement under fluoroscopy  Original Report Authenticated By: Osa Craver, M.D.     ASSESSMENT AND PLAN:  Acute on chronic respiratory failure Status post tracheostomy COPD Pneumonia, resolved Persistent LLL infiltrate L pleural effusion >> exudate Iatrogenic L PTX >> pigtail by IR 5/30  - Resum vent wean as tolerated - Cont Bronchodilators - Cont Pulmonary hygiene - F/U pleural fluid cx - Abx per primary    PCCM will see gain 6/3. Please call sooner PRN    Onalee Hua  Sung AmabileSimonds, MD;  PCCM service; Mobile [phone]

## 2012-02-22 ENCOUNTER — Other Ambulatory Visit (HOSPITAL_COMMUNITY): Payer: Self-pay

## 2012-02-22 LAB — CBC
HCT: 36 % — ABNORMAL LOW (ref 39.0–52.0)
Hemoglobin: 11.4 g/dL — ABNORMAL LOW (ref 13.0–17.0)
MCH: 28.9 pg (ref 26.0–34.0)
MCV: 91.1 fL (ref 78.0–100.0)
RBC: 3.95 MIL/uL — ABNORMAL LOW (ref 4.22–5.81)

## 2012-02-22 LAB — BASIC METABOLIC PANEL
CO2: 39 mEq/L — ABNORMAL HIGH (ref 19–32)
Glucose, Bld: 193 mg/dL — ABNORMAL HIGH (ref 70–99)
Potassium: 3.4 mEq/L — ABNORMAL LOW (ref 3.5–5.1)
Sodium: 134 mEq/L — ABNORMAL LOW (ref 135–145)

## 2012-02-23 ENCOUNTER — Other Ambulatory Visit (HOSPITAL_COMMUNITY): Payer: Self-pay

## 2012-02-23 LAB — BASIC METABOLIC PANEL WITH GFR
BUN: 50 mg/dL — ABNORMAL HIGH (ref 6–23)
CO2: 40 meq/L (ref 19–32)
Calcium: 8.8 mg/dL (ref 8.4–10.5)
Chloride: 92 meq/L — ABNORMAL LOW (ref 96–112)
Creatinine, Ser: 0.46 mg/dL — ABNORMAL LOW (ref 0.50–1.35)
GFR calc Af Amer: >90 mL/min
GFR calc non Af Amer: >90 mL/min
Glucose, Bld: 117 mg/dL — ABNORMAL HIGH (ref 70–99)
Potassium: 3.4 meq/L — ABNORMAL LOW (ref 3.5–5.1)
Sodium: 139 meq/L (ref 135–145)

## 2012-02-23 LAB — CBC
Hemoglobin: 9.5 g/dL — ABNORMAL LOW (ref 13.0–17.0)
MCHC: 31.5 g/dL (ref 30.0–36.0)
Platelets: 235 10*3/uL (ref 150–400)
RBC: 3.27 MIL/uL — ABNORMAL LOW (ref 4.22–5.81)

## 2012-02-23 LAB — TOBRAMYCIN LEVEL, TROUGH: Tobramycin Tr: 8.5 ug/mL (ref 0.5–2.0)

## 2012-02-23 NOTE — Progress Notes (Signed)
  Subjective: No new complaints. Appears comfortable.   Objective: Vital signs in last 24 hours:  Afebrile, VSS    Physical exam :  Patient appears comfortable.  Denies increased CP or SOB.  No air leak noted in pleur vac- approximately 300 ml in pleur vac. Additional suction container attached ??? 200 ml bloody fluid in same - disconnected and chest tube placed back on wall suction.  RN notified.    Insertion site of chest drain clean and dry. Rales on lung exam with diminished breath sounds bilaterally.    Lab Results:   [facility] 02/23/12 0615 02/22/12 0755  WBC 10.5 14.5*  HGB 9.5* 11.4*  HCT 30.2* 36.0*  PLT 235 244   BMET  Basename 02/23/12 0615 02/22/12 0755  NA 139 134*  K 3.4* 3.4*  CL 92* 83*  CO2 40* 39*  GLUCOSE 117* 193*  BUN 50* 61*  CREATININE 0.46* 0.59  CALCIUM 8.8 9.3   PT/INR  Basename 02/20/12 1302  LABPROT 13.8  INR 1.04    Assessment/Plan : Hydrothorax s/p thoracentesis resulting in necessity of drainage catheter placed 02/20/12.  No changes in plan at this time - to continue pleur vac on wall suction.  IR to follow.    LOS: 59 days    Eban Weick D

## 2012-02-23 NOTE — Progress Notes (Signed)
  Subjective: No new complaints.  Denies any significant changes in respiratory symptoms.   Objective: Vital signs in last 24 hours: 97.6, 94/59, HR: 72, 92 percent oxygen saturations, Respirations : 16.      PE:  Patient quiet, seems comfortable. Denies pain.  Chest tube intact with insertion site clean and dry. 400 ml fluid in pleur vac.  No evidence of leak. RN confirmed 200 ml in extra cannister yesterday was from suction- not from chest drain.  No significant change in respiratory examination today compared with yesterday.  CXR shows some improvement - no residual ptx.    Lab Results:   [facility] 02/23/12 0615 02/22/12 0755  WBC 10.5 14.5*  HGB 9.5* 11.4*  HCT 30.2* 36.0*  PLT 235 244   BMET  Basename 02/23/12 0615 02/22/12 0755  NA 139 134*  K 3.4* 3.4*  CL 92* 83*  CO2 40* 39*  GLUCOSE 117* 193*  BUN 50* 61*  CREATININE 0.46* 0.59  CALCIUM 8.8 9.3   PT/INR  Basename 02/20/12 1302  LABPROT 13.8  INR 1.04    Studies/Results: Dg Chest Port 1 View  02/23/2012  *RADIOLOGY REPORT*  Clinical Data: Hypoxia, COPD, pneumothorax, left chest tube  PORTABLE CHEST - 1 VIEW  Comparison: 02/22/2012  Findings: Tracheostomy and left PICC line tip are stable.  Left base pigtail chest drain remains.  Background bullous emphysema persists.  Interval improvement in the left lower lobe airspace disease.  No enlarging or significant pneumothorax demonstrated by plain radiography.  Stable right lung aeration.  IMPRESSION: Improving left lung airspace process as described.  Background bullous emphysema  No enlarging or significant pneumothorax demonstrated  Original Report Authenticated By: Judie Petit. Ruel Favors, M.D.   Dg Chest Port 1 View  02/22/2012  *RADIOLOGY REPORT*  Clinical Data: Follow-up chest tube placement  PORTABLE CHEST - 1 VIEW  Comparison: Chest radiograph 02/21/2012  Findings: Tracheostomy tube, left PICC line, and left chest tube are unchanged.  Left lower lobe air space disease and  atelectasis are unchanged.  No pneumothorax appreciated.  Fine interstitial pattern at the right lung base.  IMPRESSION:  1.  No significant change. 2.  Stable support apparatus. 3.  No left pneumothorax. 4.  Stable consolidated air space disease in the right lower lobe. 5.  New fine interstitial pattern in the left lower lobe could represent edema, pneumonitis, or potentially pneumonia.  Original Report Authenticated By: Genevive Bi, M.D.    Assessment/Plan: Hydrothorax following thoracentesis s/p drain 5/30.  Remains on wall suction.  CXR reveals no recurrent ptx.  To continue wall suction today - may want to attempt to waterseal over next 24-28 hours if oxygen saturations do not diminish and no significant change in output.  100 ml drainage confirmed since yesterday.  Will recheck tomorrow along with CXR and discuss management of chest drain.    LOS: 59 days    Melba Araki D 02/23/2012

## 2012-02-24 ENCOUNTER — Other Ambulatory Visit (HOSPITAL_COMMUNITY): Payer: Self-pay

## 2012-02-24 LAB — BODY FLUID CULTURE: Culture: NO GROWTH

## 2012-02-24 LAB — MAGNESIUM: Magnesium: 1.9 mg/dL (ref 1.5–2.5)

## 2012-02-24 LAB — BASIC METABOLIC PANEL
BUN: 38 mg/dL — ABNORMAL HIGH (ref 6–23)
Chloride: 93 mEq/L — ABNORMAL LOW (ref 96–112)
GFR calc Af Amer: >90 mL/min (ref >90)
Potassium: 4 mEq/L (ref 3.5–5.1)
Sodium: 137 mEq/L (ref 135–145)

## 2012-02-24 LAB — CBC
HCT: 31 % — ABNORMAL LOW (ref 39.0–52.0)
RDW: 17.8 % — ABNORMAL HIGH (ref 11.5–15.5)
WBC: 7.5 10*3/uL (ref 4.0–10.5)

## 2012-02-24 NOTE — Progress Notes (Addendum)
                                                                    Name: Jeremy Vance MRN: [id number] DOB: 1941/02/11    LOS: 60 Requesting MD:  Arelia Longest Regarding:  VDRF  PULMONARY / CRITICAL CARE MEDICINE  History of Present Illness: 71 yo with COPD and pneumonia complicated by PEA arrest  Interval history:   L Thoracentesis 5/30 >> exudate by chemistries L PTX 5/30 >> L pigtail placed by IR Back on vent support since 5/30 PM  Vital signs:  Reviewed.  Physical Examination: General: Comfortable, no distress on full vent support Neuro: Follows some commands HEENT: Tracheostomy in place, wound clean Cardiovascular: Regular heart, no murmurs Lungs: Bilateral diminished air entry Abdomen: Soft, nonditended Musculoskeletal: Trace edema  Labs:   Lab 02/24/12 0602 02/23/12 0615 02/22/12 0755  NA 137 139 134*  K 4.0 3.4* 3.4*  CL 93* 92* 83*  CO2 37* 40* 39*  BUN 38* 50* 61*  CREATININE 0.39* 0.46* 0.59  GLUCOSE 62* 117* 193*    Lab 02/24/12 0602 02/23/12 0615 02/22/12 0755  HGB 9.7* 9.5* 11.4*  HCT 31.0* 30.2* 36.0*  WBC 7.5 10.5 14.5*  PLT 253 235 244   Imaging:   Dg Chest Port 1 View  02/23/2012  *RADIOLOGY REPORT*  Clinical Data: Hypoxia, COPD, pneumothorax, left chest tube  PORTABLE CHEST - 1 VIEW  Comparison: 02/22/2012  Findings: Tracheostomy and left PICC line tip are stable.  Left base pigtail chest drain remains.  Background bullous emphysema persists.  Interval improvement in the left lower lobe airspace disease.  No enlarging or significant pneumothorax demonstrated by plain radiography.  Stable right lung aeration.  IMPRESSION: Improving left lung airspace process as described.  Background bullous emphysema  No enlarging or significant pneumothorax demonstrated  Original Report Authenticated By: Judie Petit. Ruel Favors, M.D.     ASSESSMENT AND PLAN:  Acute on chronic respiratory failure Status post tracheostomy COPD Persistent LLL  infiltrate w/ pseudomonas continuing to grow in sputum.  L pleural effusion >> exudate Iatrogenic L PTX >> pigtail by IR 5/30 plan - Resume vent wean as tolerated/ will likely need nocturnal ventilation indefinitely given his debilitation.  - Cont Bronchodilators - Cont Pulmonary hygiene - defer pigtail to IR. Agree of PCXR ok on waterseal ok to remove  (since dispo will be an issue if we leave this in) - Abx per primary  Independently examined pt, evaluated data & formulated above care plan with NP  Cyril Mourning MD. FCCP. [facility] Pager [phone] If no response call [phone]

## 2012-02-24 NOTE — Progress Notes (Signed)
  Subjective: Pt without new c/o  Objective: Vital signs in last 24 hours:    VSS:AF  Intake/Output from previous day:   Intake/Output this shift:    Left chest tube intact, insertion site ok, no air leak; about 450 cc's fluid in pleuravac, cx's pend; today's CXR pend ; no residual PTX on 6/2 film; diminshed BS bilat.  Lab Results:   [facility] 02/24/12 0602 02/23/12 0615  WBC 7.5 10.5  HGB 9.7* 9.5*  HCT 31.0* 30.2*  PLT 253 235   BMET  Basename 02/24/12 0602 02/23/12 0615  NA 137 139  K 4.0 3.4*  CL 93* 92*  CO2 37* 40*  GLUCOSE 62* 117*  BUN 38* 50*  CREATININE 0.39* 0.46*  CALCIUM 9.1 8.8   PT/INR No results found for this basename: LABPROT:2,INR:2 in the last 72 hours ABG No results found for this basename: PHART:2,PCO2:2,PO2:2,HCO3:2 in the last 72 hours  Studies/Results: Dg Chest Port 1 View  02/23/2012  *RADIOLOGY REPORT*  Clinical Data: Hypoxia, COPD, pneumothorax, left chest tube  PORTABLE CHEST - 1 VIEW  Comparison: 02/22/2012  Findings: Tracheostomy and left PICC line tip are stable.  Left base pigtail chest drain remains.  Background bullous emphysema persists.  Interval improvement in the left lower lobe airspace disease.  No enlarging or significant pneumothorax demonstrated by plain radiography.  Stable right lung aeration.  IMPRESSION: Improving left lung airspace process as described.  Background bullous emphysema  No enlarging or significant pneumothorax demonstrated  Original Report Authenticated By: Judie Petit. Ruel Favors, M.D.    Anti-infectives: Anti-infectives    None      Assessment/Plan: s/p left hydroptx/left chest tube following left thoracentesis 5/30; will place pleuravac to water seal today and check CXR in am; if ok, will plan to remove tube. Above d/w pulmonary.    LOS: 60 days    Bralon Antkowiak,D [facility] 02/24/2012

## 2012-02-25 ENCOUNTER — Other Ambulatory Visit (HOSPITAL_COMMUNITY): Payer: Self-pay

## 2012-02-25 LAB — ANAEROBIC CULTURE

## 2012-02-26 ENCOUNTER — Other Ambulatory Visit (HOSPITAL_COMMUNITY): Payer: Self-pay

## 2012-02-26 LAB — BASIC METABOLIC PANEL
CO2: 36 mEq/L — ABNORMAL HIGH (ref 19–32)
Chloride: 92 mEq/L — ABNORMAL LOW (ref 96–112)
Glucose, Bld: 137 mg/dL — ABNORMAL HIGH (ref 70–99)
Potassium: 3.5 mEq/L (ref 3.5–5.1)
Sodium: 135 mEq/L (ref 135–145)

## 2012-02-26 LAB — CBC
Hemoglobin: 9.5 g/dL — ABNORMAL LOW (ref 13.0–17.0)
MCH: 28.8 pg (ref 26.0–34.0)
Platelets: 244 10*3/uL (ref 150–400)
RBC: 3.3 MIL/uL — ABNORMAL LOW (ref 4.22–5.81)
WBC: 7.6 10*3/uL (ref 4.0–10.5)

## 2012-02-26 LAB — CULTURE, RESPIRATORY W GRAM STAIN

## 2012-02-26 LAB — TOBRAMYCIN LEVEL, TROUGH: Tobramycin Tr: 2.7 ug/mL (ref 0.5–2.0)

## 2012-02-26 NOTE — Progress Notes (Signed)
Name: Jeremy Vance MRN: [id number] DOB: 26-Mar-1941    LOS: 62 Requesting MD:  Arelia Longest Regarding:  VDRF  PULMONARY / CRITICAL CARE MEDICINE  History of Present Illness: 71 yo with COPD and pneumonia complicated by PEA arrest. Left effusion s/p thoracentesis -exudative.  CT placed per IR.     Interval history:   L Thoracentesis 5/30 >> exudate by chemistries L PTX 5/30 >> L pigtail placed by IR  Weaning on ATC 30% with nocturnal vent.  No acute events.   Vital signs:  Reviewed.  Physical Examination: General: Comfortable, no distress on full vent support Neuro: Follows some commands HEENT: Tracheostomy in place, wound clean Cardiovascular: Regular heart, no murmurs.  Pigtail catheter L chest.  No air leak. Water seal >48 hours.  Lungs: Bilateral diminished air entry Abdomen: Soft, nonditended Musculoskeletal: Trace edema  Labs:   Lab 02/26/12 0633 02/24/12 0602 02/23/12 0615  NA 135 137 139  K 3.5 4.0 3.4*  CL 92* 93* 92*  CO2 36* 37* 40*  BUN 28* 38* 50*  CREATININE 0.39* 0.39* 0.46*  GLUCOSE 137* 62* 117*    Lab 02/26/12 0633 02/24/12 0602 02/23/12 0615  HGB 9.5* 9.7* 9.5*  HCT 30.3* 31.0* 30.2*  WBC 7.6 7.5 10.5  PLT 244 253 235   Imaging:   Dg Chest Port 1 View  02/26/2012  *RADIOLOGY REPORT*  Clinical Data: Status post thoracentesis.  PORTABLE CHEST - 1 VIEW  Comparison: 02/25/2012  Findings: 0907 hours. The cardiopericardial silhouette is enlarged. Retrocardiac collapse / consolidation persist.  Pigtail catheter is again noted over the lower left hemithorax. Left-sided PICC line tip projects at the proximal SVC level.  Emphysema is again noted without evidence for pneumothorax.  There is a tracheostomy tube in situ. Telemetry leads overlie the chest.  IMPRESSION: No substantial interval change exam.  Cardiomegaly with retrocardiac collapse / consolidation and pigtail  catheter overlying the lower left chest.  Original Report Authenticated By: ERIC A. MANSELL, M.D.   Dg Chest Port 1 View  02/25/2012  *RADIOLOGY REPORT*  Clinical Data: Left-sided chest tube, shortness of breath  PORTABLE CHEST - 1 VIEW  Comparison: 02/24/2012; 02/23/2012; 02/22/2012; fluoroscopic guided chest tube placement - 02/21/2012  Findings: Grossly unchanged enlarged cardiac silhouette and mediastinal contours.  Stable positioning of support apparatus including left-sided chest tube.  The lungs are hyperexpanded with bullous emphysematous change within the bilateral upper lungs.  No definite pneumothorax.  Grossly unchanged left basilar/retrocardiac heterogeneous / consolidative opacities.  No definite pneumothorax. Unchanged small left-sided effusion.  Unchanged bones.  IMPRESSION: 1.  Stable positioning of support apparatus.  No pneumothorax. 2.  Unchanged small left-sided effusion and left basilar heterogeneous / consolidative opacities, atelectasis versus infiltrate.  3. Stable findings of bullous emphysematous disease.  Original Report Authenticated By: Waynard Reeds, M.D.   Dg Chest Port 1 View  02/24/2012  *RADIOLOGY REPORT*  Clinical Data: Status post left-sided chest tube placement for hydropneumothorax.  PORTABLE CHEST - 1 VIEW  Comparison: 1 day prior  Findings: Patient rotated to the left.  Tracheostomy appropriately position.  Left-sided PICC line terminates at the mid  SVC.  There is a small bore left-sided chest tube which is unchanged in position. No pneumothorax.  Probable small left pleural effusion, similar.  The right costophrenic angle is partially excluded.  There is underlying hyperinflation.  Left base air space disease.  IMPRESSION:  1. No significant change since one day prior. 2.  Small bore left-sided chest tube in place with similar pleural fluid but no pneumothorax. 3.  Similar left base air space disease.  Original Report Authenticated By: Consuello Bossier, M.D.      ASSESSMENT AND PLAN:  Acute on chronic respiratory failure Status post tracheostomy COPD Persistent LLL infiltrate w/ pseudomonas continuing to grow in sputum.  L pleural effusion >> exudate Iatrogenic L PTX >> pigtail by IR 5/30 Plan - Resume vent wean as tolerated/ will likely need nocturnal ventilation indefinitely given his debilitation.  - Cont Bronchodilators - Cont Pulmonary hygiene - OK to remove pigtail cath -->water seal >48 hours, minimal drainage, no air leak.  Discussed with IR -f/u cxr 4 hours after CT removal - Abx per primary    Canary Brim, NP-C [place] [facility] Pgr: [phone] or [phone]     PCCM attending: Pt seen, examined and discussed with NP above. Assessment and plan as reflected in the note above  Billy Fischer, MD;  PCCM service; Mobile [phone]

## 2012-02-26 NOTE — Progress Notes (Signed)
  Subjective: Chest tube placed after Lt thora- ptx on 5/30 Water sealed 6/3 CXR 6/4: no ptx Did not pull tube 6/4 per Dr Vassie Loll CXR - none yet today.... Pull tube today?  Objective: Vital signs in last 24 hours:      Intake/Output from previous day:   Intake/Output this shift:    PE: chest tube intact 550 cc in pleurvac now (500cc 6/4) Serous fluid afeb Wbc wnl Will check cxr  Lab Results:   [facility] 02/26/12 0633 02/24/12 0602  WBC 7.6 7.5  HGB 9.5* 9.7*  HCT 30.3* 31.0*  PLT 244 253   BMET  Basename 02/26/12 0633 02/24/12 0602  NA 135 137  K 3.5 4.0  CL 92* 93*  CO2 36* 37*  GLUCOSE 137* 62*  BUN 28* 38*  CREATININE 0.39* 0.39*  CALCIUM 8.8 9.1   PT/INR No results found for this basename: LABPROT:2,INR:2 in the last 72 hours ABG No results found for this basename: PHART:2,PCO2:2,PO2:2,HCO3:2 in the last 72 hours  Studies/Results: Dg Chest Port 1 View  02/25/2012  *RADIOLOGY REPORT*  Clinical Data: Left-sided chest tube, shortness of breath  PORTABLE CHEST - 1 VIEW  Comparison: 02/24/2012; 02/23/2012; 02/22/2012; fluoroscopic guided chest tube placement - 02/21/2012  Findings: Grossly unchanged enlarged cardiac silhouette and mediastinal contours.  Stable positioning of support apparatus including left-sided chest tube.  The lungs are hyperexpanded with bullous emphysematous change within the bilateral upper lungs.  No definite pneumothorax.  Grossly unchanged left basilar/retrocardiac heterogeneous / consolidative opacities.  No definite pneumothorax. Unchanged small left-sided effusion.  Unchanged bones.  IMPRESSION: 1.  Stable positioning of support apparatus.  No pneumothorax. 2.  Unchanged small left-sided effusion and left basilar heterogeneous / consolidative opacities, atelectasis versus infiltrate.  3. Stable findings of bullous emphysematous disease.  Original Report Authenticated By: Waynard Reeds, M.D.   Dg Chest Port 1 View  02/24/2012  *RADIOLOGY  REPORT*  Clinical Data: Status post left-sided chest tube placement for hydropneumothorax.  PORTABLE CHEST - 1 VIEW  Comparison: 1 day prior  Findings: Patient rotated to the left.  Tracheostomy appropriately position.  Left-sided PICC line terminates at the mid SVC.  There is a small bore left-sided chest tube which is unchanged in position. No pneumothorax.  Probable small left pleural effusion, similar.  The right costophrenic angle is partially excluded.  There is underlying hyperinflation.  Left base air space disease.  IMPRESSION:  1. No significant change since one day prior. 2.  Small bore left-sided chest tube in place with similar pleural fluid but no pneumothorax. 3.  Similar left base air space disease.  Original Report Authenticated By: Consuello Bossier, M.D.    Anti-infectives: Anti-infectives    None      Assessment/Plan:  s/p Left chest tube placed 5/30 Post Lt thora Intact chest tube- has been water sealed since 6/3 Check cxr today - ? Pull Will check with Dr Sheffield Slider A 02/26/2012

## 2012-02-27 ENCOUNTER — Other Ambulatory Visit (HOSPITAL_COMMUNITY): Payer: Self-pay

## 2012-02-27 LAB — CBC
MCHC: 31.5 g/dL (ref 30.0–36.0)
Platelets: 256 10*3/uL (ref 150–400)
RDW: 17.3 % — ABNORMAL HIGH (ref 11.5–15.5)
WBC: 6.8 10*3/uL (ref 4.0–10.5)

## 2012-02-27 LAB — TOBRAMYCIN LEVEL, TROUGH: Tobramycin Tr: 0.7 ug/mL (ref 0.5–2.0)

## 2012-02-28 LAB — BASIC METABOLIC PANEL
Calcium: 9.2 mg/dL (ref 8.4–10.5)
GFR calc Af Amer: >90 mL/min (ref >90)
GFR calc non Af Amer: >90 mL/min (ref >90)
Glucose, Bld: 130 mg/dL — ABNORMAL HIGH (ref 70–99)
Sodium: 136 mEq/L (ref 135–145)

## 2012-02-28 NOTE — Progress Notes (Addendum)
Name: Jeremy Vance MRN: [id number] DOB: 01-Nov-1940    LOS: 64 Requesting MD:  Arelia Longest Regarding:  VDRF  PULMONARY / CRITICAL CARE MEDICINE  History of Present Illness: 71 yo with COPD and pneumonia complicated by PEA arrest. Left effusion s/p thoracentesis -exudative.  CT placed per IR.     Interval history:   L Thoracentesis 5/30 >> exudate by chemistries L PTX 5/30 >> L pigtail placed by IR  Weaning on ATC 30% with nocturnal vent.  No acute events.   Vital signs:  Reviewed.  Physical Examination: General: Comfortable, no distress on full vent support Neuro: Follows some commands HEENT: Tracheostomy in place, wound clean Cardiovascular: Regular heart, no murmurs.   Lungs: Bilateral diminished air entry Abdomen: Soft, nonditended Musculoskeletal: Trace edema  Labs:   Lab 02/28/12 0625 02/26/12 0633 02/24/12 0602  NA 136 135 137  K 3.5 3.5 4.0  CL 91* 92* 93*  CO2 38* 36* 37*  BUN 28* 28* 38*  CREATININE 0.34* 0.39* 0.39*  GLUCOSE 130* 137* 62*    Lab 02/27/12 0500 02/26/12 0633 02/24/12 0602  HGB 9.5* 9.5* 9.7*  HCT 30.2* 30.3* 31.0*  WBC 6.8 7.6 7.5  PLT 256 244 253   Imaging:   Dg Chest Port 1 View  02/27/2012  *RADIOLOGY REPORT*  Clinical Data: Anoxia, tracheostomy  PORTABLE CHEST - 1 VIEW  Comparison: 02/26/2012  Findings: Underlying changes of COPD are evident with emphysematous change in the upper lung zones.  Cardiomegaly is stable. A stable mediastinal contour is seen.  There is persistent infiltrate identified involving the left lower lobe with associated left pleural effusion.  This is unchanged in comparison with prior exam. The right lung zone demonstrates improvement in interstitial septal lines and interstitial prominence suggesting resolved mild interstitial fluid.  No new focal infiltrates or signs of congestive failure are evident.  No pneumothorax is noted.   The patient's tracheostomy tube and left peripheral central venous catheters are stable in position.  IMPRESSION: Stable support tubes.  Unchanged left lower lobe pneumonia and pleural fluid. Resolved mild interstitial fluid  Original Report Authenticated By: Bertha Stakes, M.D.   Dg Chest Port 1 View  02/26/2012  *RADIOLOGY REPORT*  Clinical Data: Chronic ventilator dependent respiratory failure. Left chest tube removal.  PORTABLE CHEST - 1 VIEW 02/26/2012 1215 hours:  Comparison: Portable chest x-ray earlier same date 0907 hours.  Findings: Interval removal of the pigtail drainage catheter from the left pleural space.  No evidence of pneumothorax.  Severe bullous emphysematous changes in both lungs.  Worsening airspace consolidation throughout the left lung.  Stable left pleural effusion.  No new pulmonary parenchymal abnormalities on the right. Tracheostomy tube tip remains in satisfactory position below the thoracic inlet.  IMPRESSION:  1.  No pneumothorax after removal of left pleural catheter. 2.  Worsening pneumonia in the left lung.  Stable left pleural effusion. 3.  Severe COPD/emphysema.  Original Report Authenticated By: Arnell Sieving, M.D.   Dg Chest Port 1 View  02/26/2012  *RADIOLOGY REPORT*  Clinical Data: Status post thoracentesis.  PORTABLE CHEST - 1 VIEW  Comparison: 02/25/2012  Findings: 0907 hours. The cardiopericardial silhouette is enlarged. Retrocardiac collapse / consolidation persist.  Pigtail catheter is again noted over the lower left hemithorax. Left-sided PICC line tip projects at the proximal SVC level.  Emphysema is again noted without evidence for pneumothorax.  There is a tracheostomy tube in situ. Telemetry leads overlie the chest.  IMPRESSION: No substantial interval change exam.  Cardiomegaly with retrocardiac collapse / consolidation and pigtail catheter overlying the lower left chest.  Original Report Authenticated By: ERIC A. MANSELL, M.D.     ASSESSMENT AND  PLAN:  1)Acute on chronic respiratory failure/ Status post tracheostomy 2) COPD 3) Persistent LLL infiltrate w/ pseudomonas continuing to grow in sputum: no sig change radiographically on 6/7 4) L pleural effusion >> exudate c/w parapneumonic  5) Iatrogenic L PTX >> pigtail by IR 5/30, removed on 6/5. No residual PTX.  Plan - ATC as tolerated/ will likely need nocturnal ventilation indefinitely given his debilitation -  - Cont Bronchodilators - Cont Pulmonary hygiene - Abx per primary -await vent SNF -we will see weekly from this point on.    Independently examined pt, evaluated data & formulated above care plan with NP  [facility].

## 2012-02-29 LAB — BASIC METABOLIC PANEL
BUN: 31 mg/dL — ABNORMAL HIGH (ref 6–23)
GFR calc Af Amer: >90 mL/min (ref >90)
GFR calc non Af Amer: >90 mL/min (ref >90)
Potassium: 3.5 mEq/L (ref 3.5–5.1)
Sodium: 136 mEq/L (ref 135–145)

## 2012-03-01 ENCOUNTER — Other Ambulatory Visit (HOSPITAL_COMMUNITY): Payer: Self-pay

## 2012-03-01 LAB — BASIC METABOLIC PANEL
Chloride: 92 mEq/L — ABNORMAL LOW (ref 96–112)
Creatinine, Ser: 0.33 mg/dL — ABNORMAL LOW (ref 0.50–1.35)
GFR calc Af Amer: >90 mL/min (ref >90)
Sodium: 135 mEq/L (ref 135–145)

## 2012-03-01 LAB — CBC
HCT: 32.7 % — ABNORMAL LOW (ref 39.0–52.0)
Platelets: 265 10*3/uL (ref 150–400)
RDW: 17.6 % — ABNORMAL HIGH (ref 11.5–15.5)
WBC: 7.3 10*3/uL (ref 4.0–10.5)

## 2012-03-02 LAB — BASIC METABOLIC PANEL
GFR calc Af Amer: >90 mL/min (ref >90)
GFR calc non Af Amer: >90 mL/min (ref >90)
Glucose, Bld: 80 mg/dL (ref 70–99)
Potassium: 3.7 mEq/L (ref 3.5–5.1)
Sodium: 138 mEq/L (ref 135–145)

## 2012-03-02 NOTE — Progress Notes (Signed)
                                                                    Name: Jeremy Vance MRN: [id number] DOB: Nov 03, 1940    LOS: 67 Requesting MD:  Arelia Longest Regarding:  VDRF  PULMONARY / CRITICAL CARE MEDICINE  History of Present Illness: 71 yo with COPD and pneumonia complicated by PEA arrest. Left effusion s/p thoracentesis -exudative.  CT placed per IR.     Interval history:   L Thoracentesis 5/30 >> exudate by chemistries L PTX 5/30 >> L pigtail placed by IR  subjective  Weaning on ATC 30% with nocturnal vent.  No acute events, states he didn't sleep well.   Vital signs:  Reviewed. Afebrile, currently on 30% ATC  Physical Examination: General: Comfortable, no distress on ATC Neuro: Follows some commands HEENT: Tracheostomy in place, wound clean Cardiovascular: Regular heart, no murmurs.   Lungs: Bilateral diminished air entry Abdomen: Soft, nonditended Musculoskeletal: Trace edema  Labs:   Lab 03/02/12 0715 03/01/12 0644 02/29/12 0704  NA 138 135 136  K 3.7 4.2 3.5  CL 93* 92* 90*  CO2 37* 34* 36*  BUN 33* 31* 31*  CREATININE 0.32* 0.33* 0.32*  GLUCOSE 80 88 131*    Lab 03/01/12 0644 02/27/12 0500 02/26/12 0633  HGB 10.3* 9.5* 9.5*  HCT 32.7* 30.2* 30.3*  WBC 7.3 6.8 7.6  PLT 265 256 244   Imaging:   Dg Chest Port 1 View  03/01/2012  *RADIOLOGY REPORT*  Clinical Data: Hypoxia  PORTABLE CHEST - 1 VIEW  Comparison: 02/27/2012  Findings: Persistent left lower lobe atelectasis with dense retrocardiac region.  Superimposed left lower lobe pneumonia with effusion is suspected. Cardiomegaly.  Streaky densities right base could represent subsegmental atelectasis or new infiltrate.  No right effusion.  Tracheostomy satisfactory position without pneumothorax.  PICC line tip from left arm approach lies with its tip in the proximal SVC.  IMPRESSION: Slight worsening aeration.  New streaky densities at the right base.  Persistent left lower lobe  process.  Original Report Authenticated By: Elsie Stain, M.D.  agree, now has RLL airspace disease v volume loss. No sig change on left.   ASSESSMENT AND PLAN:  1)Acute on chronic respiratory failure/ Status post tracheostomy 2) COPD 3) Persistent LLL infiltrate w/ pseudomonas continuing to grow in sputum: no sig change radiographically on 6/7 4) L pleural effusion >> exudate c/w parapneumonic  5) Iatrogenic L PTX >> pigtail by IR 5/30, removed on 6/5. No residual PTX.  Plan - ATC as tolerated/ will need nocturnal ventilation indefinitely given his debilitation -  - Cont Bronchodilators - Cont Pulmonary hygiene - Abx per primary -await vent SNF -we will see weekly from this point on.    Independently examined pt, evaluated data & formulated above care plan with NP  BABCOCK,PETE  I have seen and examined the patient with nurse practitioner/resident and agree with the note above.   No new recs.  We will follow weekly  Yolonda Kida PCCM Pager: [phone] Cell: [phone] If no response, call [phone]

## 2012-03-03 LAB — BASIC METABOLIC PANEL
CO2: 38 mEq/L — ABNORMAL HIGH (ref 19–32)
Chloride: 90 mEq/L — ABNORMAL LOW (ref 96–112)
Glucose, Bld: 76 mg/dL (ref 70–99)
Potassium: 4.5 mEq/L (ref 3.5–5.1)
Sodium: 136 mEq/L (ref 135–145)

## 2012-03-03 LAB — CBC
Hemoglobin: 10.4 g/dL — ABNORMAL LOW (ref 13.0–17.0)
RBC: 3.58 MIL/uL — ABNORMAL LOW (ref 4.22–5.81)

## 2012-03-06 LAB — CBC
HCT: 32.2 % — ABNORMAL LOW (ref 39.0–52.0)
Hemoglobin: 10 g/dL — ABNORMAL LOW (ref 13.0–17.0)
MCH: 28.6 pg (ref 26.0–34.0)
MCHC: 31.1 g/dL (ref 30.0–36.0)

## 2012-03-06 LAB — BASIC METABOLIC PANEL
BUN: 21 mg/dL (ref 6–23)
Chloride: 93 mEq/L — ABNORMAL LOW (ref 96–112)
Glucose, Bld: 85 mg/dL (ref 70–99)
Potassium: 3 mEq/L — ABNORMAL LOW (ref 3.5–5.1)

## 2012-03-06 LAB — DIFFERENTIAL
Basophils Relative: 0 % (ref 0–1)
Lymphs Abs: 1 10*3/uL (ref 0.7–4.0)
Monocytes Absolute: 0.5 10*3/uL (ref 0.1–1.0)
Monocytes Relative: 7 % (ref 3–12)
Neutro Abs: 5.2 10*3/uL (ref 1.7–7.7)

## 2012-03-06 NOTE — Progress Notes (Signed)
                                                                    Name: Jeremy Vance MRN: [id number] DOB: August 06, 1941    LOS: 71 Requesting MD:  Arelia Longest Regarding:  VDRF  PULMONARY / CRITICAL CARE MEDICINE  History of Present Illness: 71 yo with COPD and pneumonia complicated by PEA arrest. Left effusion s/p thoracentesis -exudative.  CT placed per IR.     Interval history:   L Thoracentesis 5/30 >> exudate by chemistries L PTX 5/30 >> L pigtail placed by IR  subjective Has been staying off vent for several days. He and his wife are refusing vent support.   Vital signs:  Reviewed. Afebrile, currently on 30% ATC  Physical Examination: General: Comfortable, no distress on ATC Neuro: Follows some commands HEENT: Tracheostomy in place, wound clean Cardiovascular: Regular heart, no murmurs.   Lungs: Bilateral diminished air entry, some accessory muscle use.  Abdomen: Soft, nonditended Musculoskeletal: Trace edema  Labs:   Lab 03/06/12 0515 03/03/12 0500 03/02/12 0715  NA 139 136 138  K 3.0* 4.5 3.7  CL 93* 90* 93*  CO2 42* 38* 37*  BUN 21 31* 33*  CREATININE 0.32* 0.33* 0.32*  GLUCOSE 85 76 80    Lab 03/06/12 0515 03/03/12 0500 03/01/12 0644  HGB 10.0* 10.4* 10.3*  HCT 32.2* 32.6* 32.7*  WBC 6.8 8.9 7.3  PLT 229 293 265   Imaging:   No results found.agree, now has RLL airspace disease v volume loss. No sig change on left.   ASSESSMENT AND PLAN:  1)Acute on chronic respiratory failure/ Status post tracheostomy 2) COPD 3) Persistent LLL infiltrate w/ pseudomonas continuing to grow in sputum: no sig change radiographically on 6/7 4) L pleural effusion >> exudate c/w parapneumonic  5) Iatrogenic L PTX >> pigtail by IR 5/30, removed on 6/5 (resolved). No residual PTX.  It is our opinion that he should be on nocturnal ventilation for his best chances of avoiding future hospitalization. He and his wife are refusing this therapy as they desire  for him to be eligible for nursing home closer to home.  Recommendaiton  -cont ATC, w/out nocturnal vent as long as pt and family accept the risk of almost certain rehospitalization after d/c and decreased life expectancy -  not a candidate for decannulation.  - Cont Bronchodilators - Cont Pulmonary hygiene - Abx per primary -await  SNF -we will sign off as we have nothing further to offer.   I have seen and examined the patient with nurse practitioner/resident and agree with the note above.   He has had multiple ups and downs from a respiratory status and though he has made progress in the last week, it is my opinion that he needs nocturnal vent for now.  He and his wife disagree.  I explained to him that I believe that he will develop respiratory failure without nocturnal mechanical ventilation at least for the next few weeks to months.  It is conceivable that he could eventually be weaned.  Yolonda Kida PCCM Pager: [phone] Cell: [phone] If no response, call [phone]

## 2012-03-07 ENCOUNTER — Other Ambulatory Visit (HOSPITAL_COMMUNITY): Payer: Self-pay

## 2012-03-08 LAB — BASIC METABOLIC PANEL
CO2: 41 mEq/L (ref 19–32)
Calcium: 8.9 mg/dL (ref 8.4–10.5)
Chloride: 90 mEq/L — ABNORMAL LOW (ref 96–112)
Glucose, Bld: 54 mg/dL — ABNORMAL LOW (ref 70–99)
Sodium: 137 mEq/L (ref 135–145)

## 2012-03-08 LAB — URINALYSIS, ROUTINE W REFLEX MICROSCOPIC
Glucose, UA: NEGATIVE mg/dL
Ketones, ur: NEGATIVE mg/dL
Leukocytes, UA: NEGATIVE
Nitrite: NEGATIVE
Specific Gravity, Urine: 1.014 (ref 1.005–1.030)
pH: 6 (ref 5.0–8.0)

## 2012-03-08 LAB — URINE MICROSCOPIC-ADD ON

## 2012-03-09 LAB — BASIC METABOLIC PANEL
CO2: 38 mEq/L — ABNORMAL HIGH (ref 19–32)
Chloride: 88 mEq/L — ABNORMAL LOW (ref 96–112)
Potassium: 5.1 mEq/L (ref 3.5–5.1)
Sodium: 133 mEq/L — ABNORMAL LOW (ref 135–145)

## 2012-03-09 LAB — CBC
MCV: 92.5 fL (ref 78.0–100.0)
Platelets: 230 10*3/uL (ref 150–400)
RBC: 3.61 MIL/uL — ABNORMAL LOW (ref 4.22–5.81)
WBC: 7.6 10*3/uL (ref 4.0–10.5)

## 2012-03-11 LAB — BASIC METABOLIC PANEL
CO2: 44 mEq/L (ref 19–32)
Chloride: 85 mEq/L — ABNORMAL LOW (ref 96–112)
Glucose, Bld: 176 mg/dL — ABNORMAL HIGH (ref 70–99)
Potassium: 4 mEq/L (ref 3.5–5.1)
Sodium: 133 mEq/L — ABNORMAL LOW (ref 135–145)

## 2012-03-11 LAB — MAGNESIUM: Magnesium: 1.7 mg/dL (ref 1.5–2.5)

## 2012-03-12 ENCOUNTER — Other Ambulatory Visit (HOSPITAL_COMMUNITY): Payer: Self-pay

## 2012-03-12 LAB — CALCIUM, IONIZED: Calcium, Ion: 1.25 mmol/L (ref 1.12–1.32)

## 2012-03-12 LAB — VITAMIN D 25 HYDROXY (VIT D DEFICIENCY, FRACTURES): Vit D, 25-Hydroxy: 23 ng/mL — ABNORMAL LOW (ref 30–89)

## 2012-03-13 LAB — CARDIAC PANEL(CRET KIN+CKTOT+MB+TROPI)
Relative Index: INVALID (ref 0.0–2.5)
Relative Index: INVALID (ref 0.0–2.5)
Relative Index: INVALID (ref 0.0–2.5)
Total CK: 17 U/L (ref 7–232)
Total CK: 17 U/L (ref 7–232)
Total CK: 25 U/L (ref 7–232)
Troponin I: <0.3 ng/mL (ref <0.30)
Troponin I: <0.3 ng/mL (ref <0.30)

## 2012-03-13 LAB — PTH, INTACT AND CALCIUM: PTH: 14.1 pg/mL (ref 14.0–72.0)

## 2012-03-14 ENCOUNTER — Other Ambulatory Visit (HOSPITAL_COMMUNITY): Payer: Self-pay

## 2012-03-14 LAB — BASIC METABOLIC PANEL
BUN: 32 mg/dL — ABNORMAL HIGH (ref 6–23)
Chloride: 89 mEq/L — ABNORMAL LOW (ref 96–112)
GFR calc Af Amer: >90 mL/min (ref >90)
GFR calc non Af Amer: >90 mL/min (ref >90)
Potassium: 4.4 mEq/L (ref 3.5–5.1)
Sodium: 134 mEq/L — ABNORMAL LOW (ref 135–145)

## 2012-03-14 LAB — DIFFERENTIAL
Basophils Absolute: 0 10*3/uL (ref 0.0–0.1)
Basophils Relative: 0 % (ref 0–1)
Eosinophils Absolute: 0.1 10*3/uL (ref 0.0–0.7)
Monocytes Relative: 8 % (ref 3–12)
Neutro Abs: 5 10*3/uL (ref 1.7–7.7)
Neutrophils Relative %: 71 % (ref 43–77)

## 2012-03-14 LAB — CBC
Hemoglobin: 11.2 g/dL — ABNORMAL LOW (ref 13.0–17.0)
MCH: 29.2 pg (ref 26.0–34.0)
MCHC: 32.1 g/dL (ref 30.0–36.0)
Platelets: 218 10*3/uL (ref 150–400)

## 2012-03-14 LAB — CARDIAC PANEL(CRET KIN+CKTOT+MB+TROPI)
CK, MB: 1.8 ng/mL (ref 0.3–4.0)
CK, MB: 1.7 ng/mL (ref 0.3–4.0)
CK, MB: 1.8 ng/mL (ref 0.3–4.0)
CK, MB: 2.2 ng/mL (ref 0.3–4.0)
Relative Index: INVALID (ref 0.0–2.5)
Relative Index: INVALID (ref 0.0–2.5)
Total CK: 15 U/L (ref 7–232)
Total CK: 12 U/L (ref 7–232)
Troponin I: <0.3 ng/mL (ref <0.30)
Troponin I: <0.3 ng/mL (ref <0.30)
Troponin I: <0.3 ng/mL (ref <0.30)

## 2012-03-14 LAB — MAGNESIUM: Magnesium: 1.8 mg/dL (ref 1.5–2.5)

## 2012-03-15 LAB — CARDIAC PANEL(CRET KIN+CKTOT+MB+TROPI)
CK, MB: 2.1 ng/mL (ref 0.3–4.0)
CK, MB: 2.2 ng/mL (ref 0.3–4.0)
Relative Index: INVALID (ref 0.0–2.5)
Relative Index: INVALID (ref 0.0–2.5)
Total CK: 17 U/L (ref 7–232)
Total CK: 13 U/L (ref 7–232)
Total CK: 17 U/L (ref 7–232)

## 2012-03-16 ENCOUNTER — Other Ambulatory Visit (HOSPITAL_COMMUNITY): Payer: Self-pay

## 2012-03-16 LAB — CBC
HCT: 35 % — ABNORMAL LOW (ref 39.0–52.0)
Hemoglobin: 11.2 g/dL — ABNORMAL LOW (ref 13.0–17.0)
MCHC: 32 g/dL (ref 30.0–36.0)
RBC: 3.84 MIL/uL — ABNORMAL LOW (ref 4.22–5.81)

## 2012-03-16 LAB — BASIC METABOLIC PANEL
BUN: 32 mg/dL — ABNORMAL HIGH (ref 6–23)
CO2: 38 mEq/L — ABNORMAL HIGH (ref 19–32)
GFR calc Af Amer: >90 mL/min (ref >90)
Glucose, Bld: 164 mg/dL — ABNORMAL HIGH (ref 70–99)
Sodium: 137 mEq/L (ref 135–145)

## 2012-03-18 LAB — DIGOXIN LEVEL: Digoxin Level: 0.3 ng/mL — ABNORMAL LOW (ref 0.8–2.0)

## 2012-07-24 DEATH — deceased

## 2013-07-28 IMAGING — CR DG CHEST 1V PORT
1 series · 1 of 1 positions shown · non-contrast
Comparison: none

REASON FOR EXAM: vent
COMMENTS:

PROCEDURE:     DXR - DXR PORTABLE CHEST SINGLE VIEW  - December 10, 2011  [DATE]
RESULT:     Comparison: 12/06/2011, 12/08/2011, 12/09/2011

[portable]
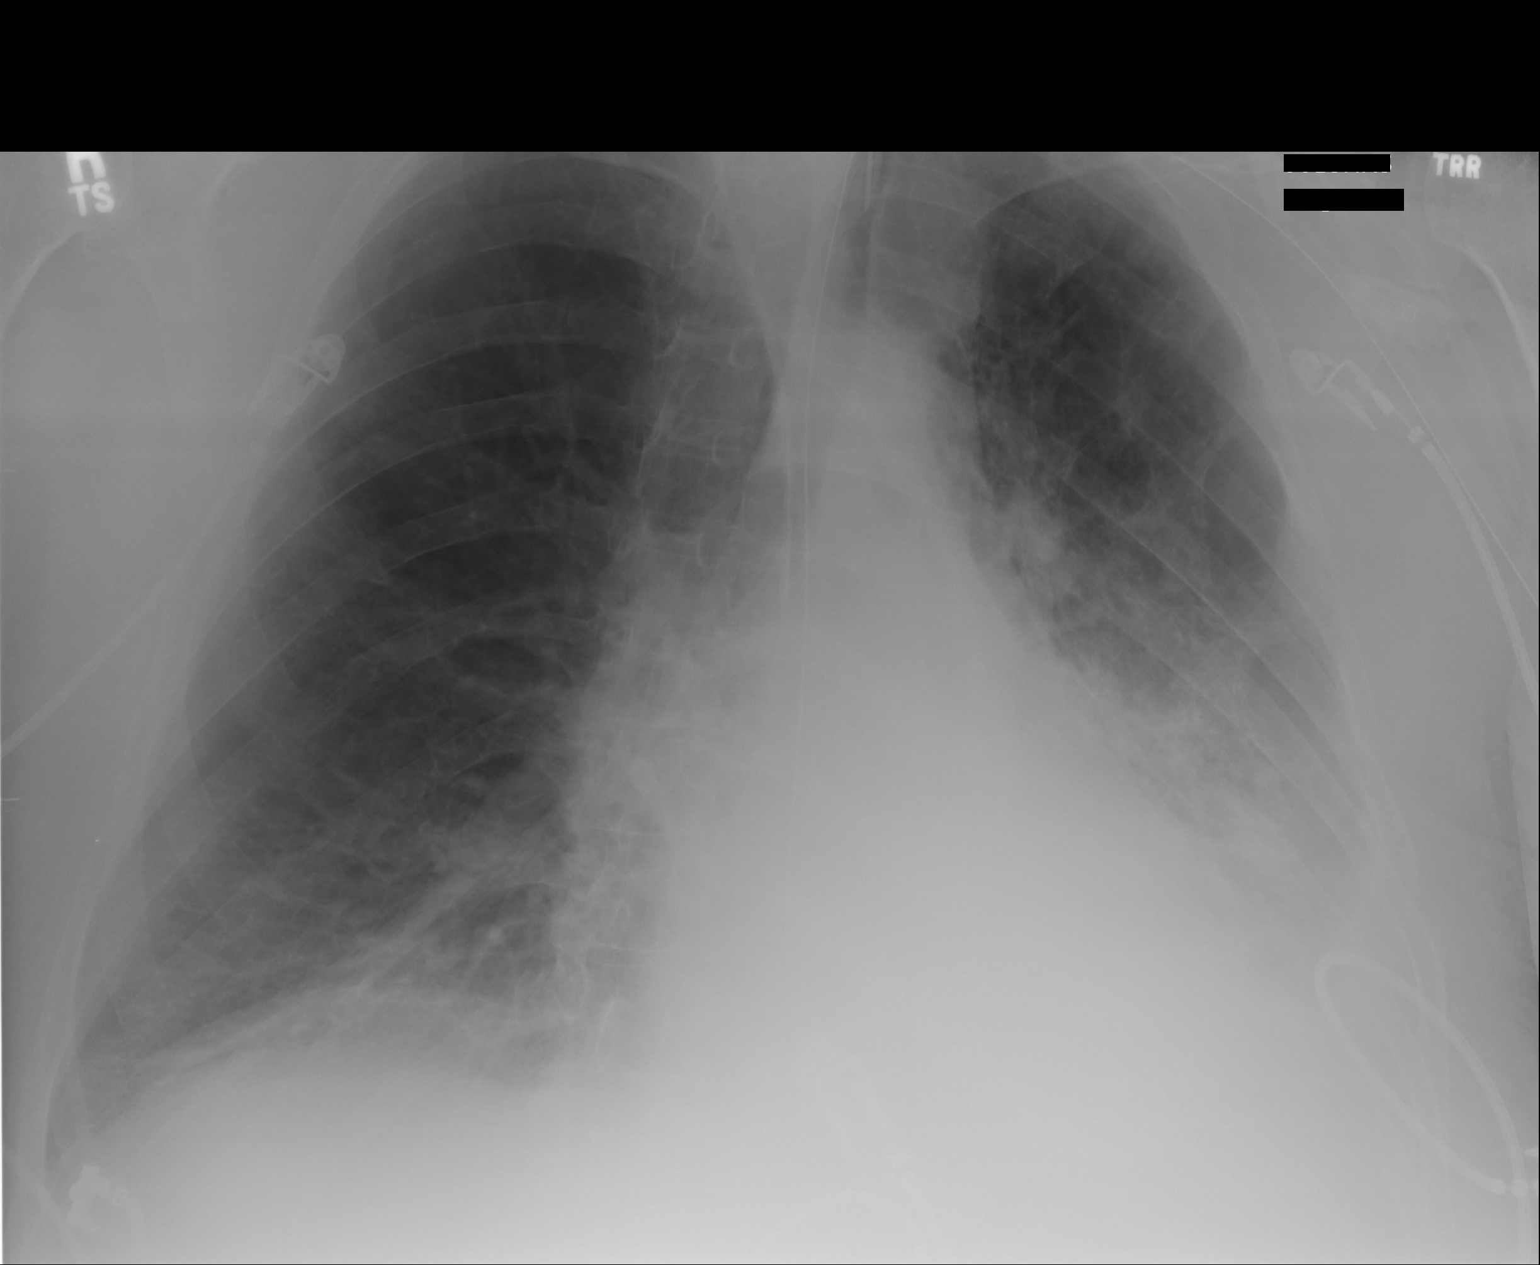

[1 of 1 positions shown; findings below may reference images not displayed]

FINDINGS: Single portable AP chest radiograph is provided. The support lines and
tubing are in unchanged position. There is persistent left lower lobe
airspace disease which may represent atelectasis versus pneumonia. There is
a trace left pleural effusion. The heart and mediastinum are stable. The
osseous structures are unremarkable.
IMPRESSION: Please see above.

[REDACTED]

## 2013-07-31 IMAGING — CR DG ABDOMEN 1V
1 series · 2 of 2 positions shown · non-contrast
Comparison: none

REASON FOR EXAM: ileus, recheck colonic gas pattern
COMMENTS:   Bedside (portable):Y

[Series 1: ap · 0.17mm/px · 2 of 2 slices shown]
[im 1/2]
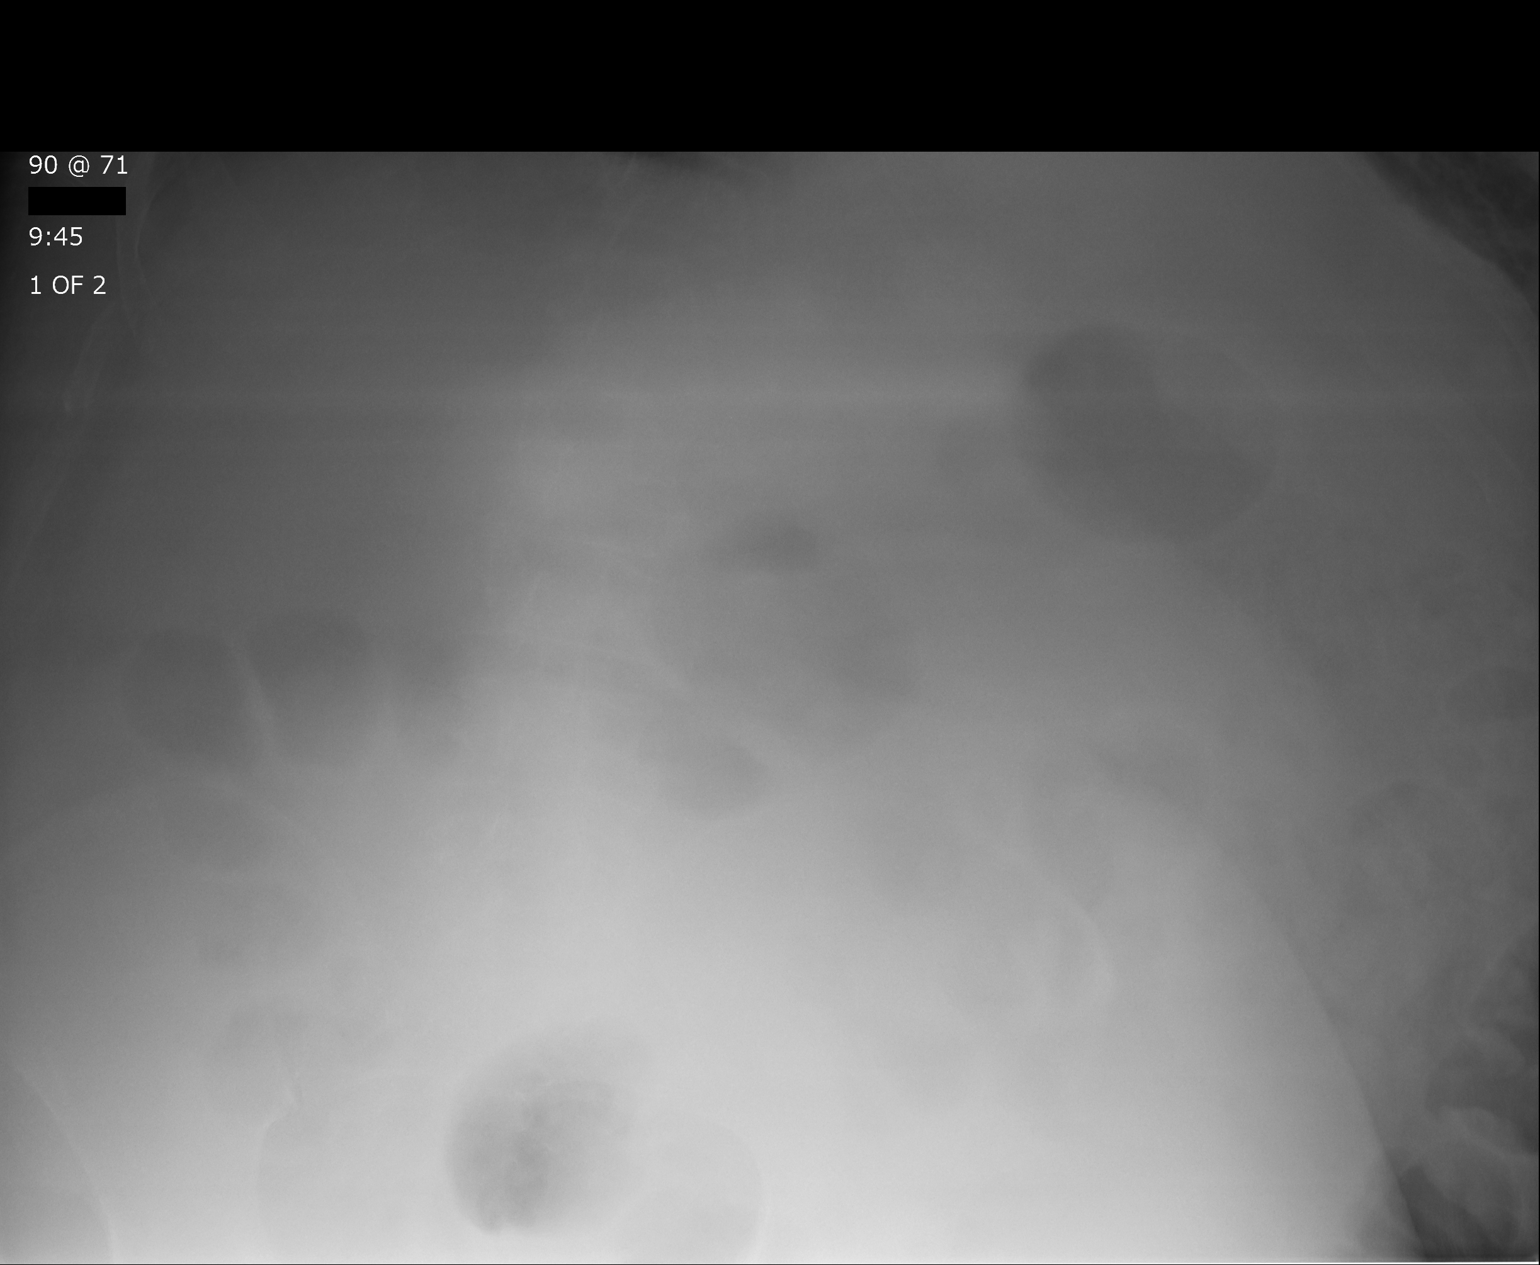
[im 2/2]
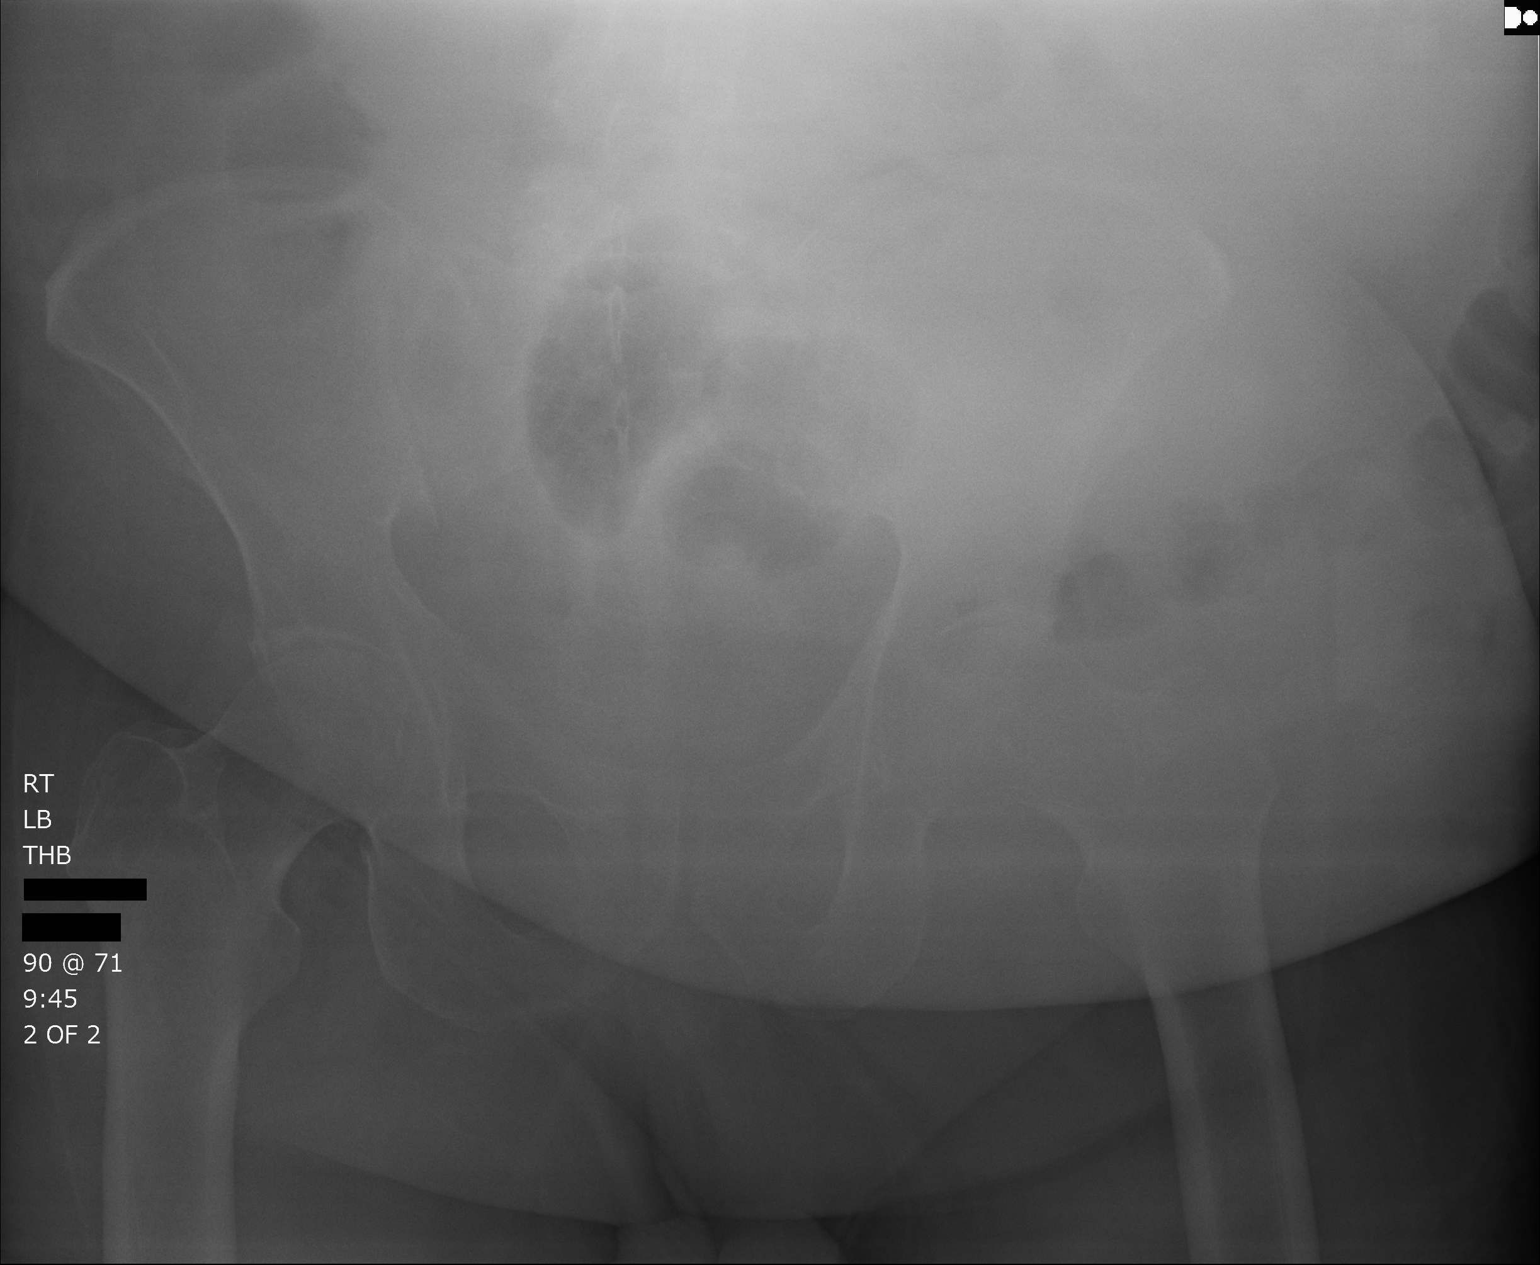

[2 of 2 positions shown; findings below may reference images not displayed]

PROCEDURE:     DXR - DXR ABDOMEN AP ONLY  - December 13, 2011  [DATE]

RESULT:     Portable views of the abdomen are grossly nondiagnostic given
the patient's body habitus and portable technique. When compared to the
previous study 11 December, 2011 less bowel gas is present. There is some air
within the colon. The lung bases are nearly clear with some minimal
atelectasis present present.
IMPRESSION: Limited diagnostic utility. No definite evidence of
obstruction visualized.

## 2013-08-16 IMAGING — CR DG ABD PORTABLE 1V
2 series · 2 of 2 positions shown · non-contrast
Comparison: None.

CLINICAL DATA: Abdominal distension

PORTABLE ABDOMEN - 1 VIEW

[AP (1 of 2)]
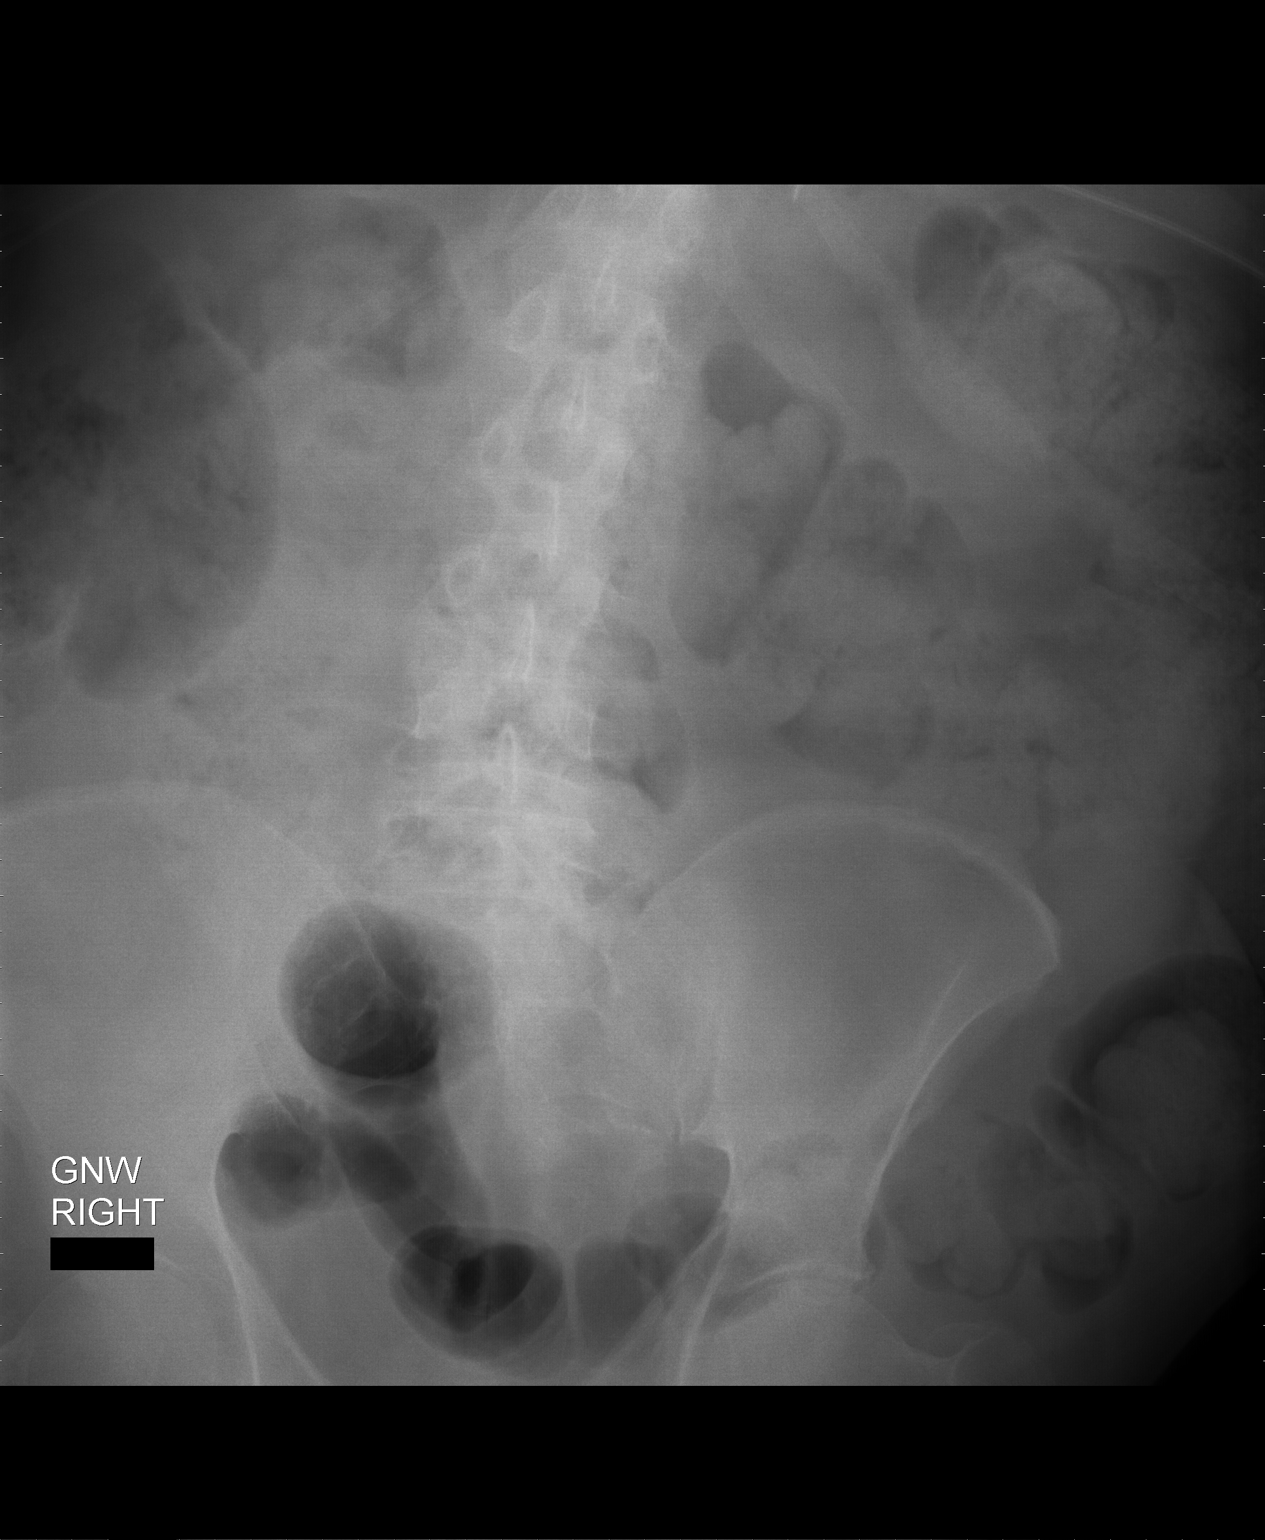

[AP (2 of 2)]
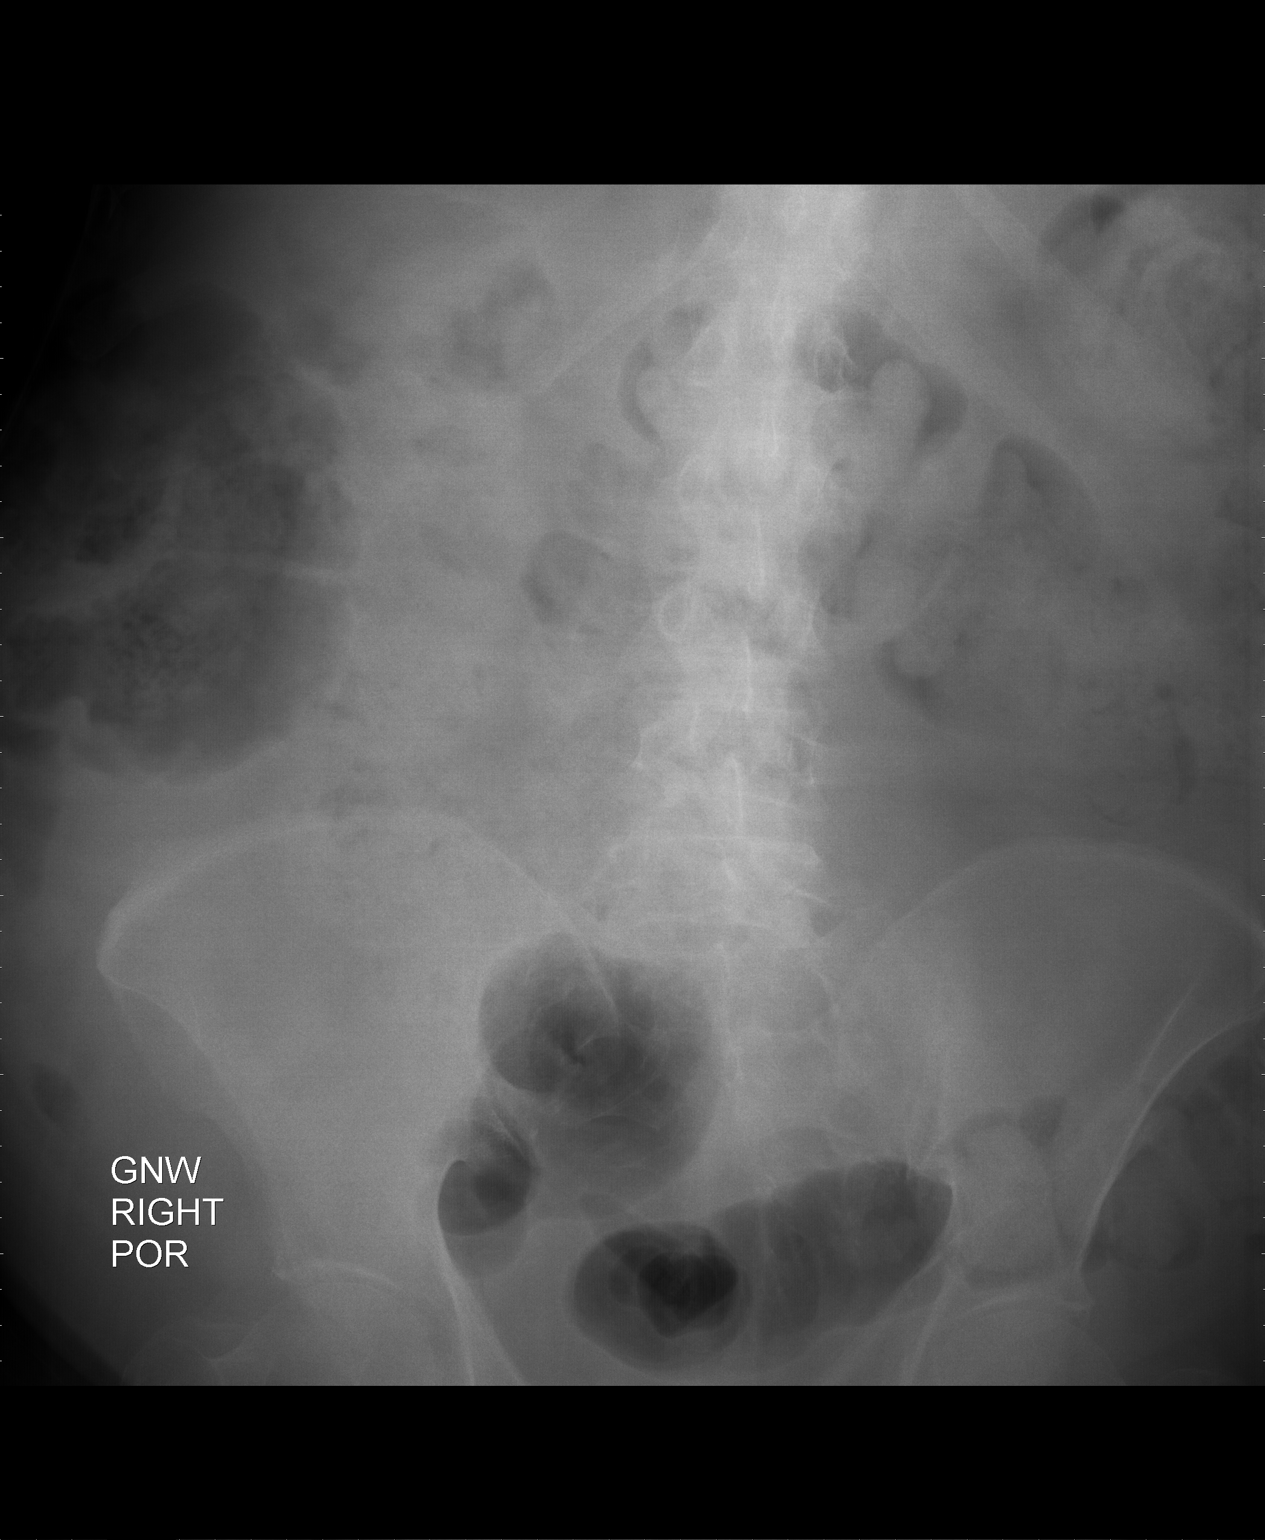

[2 of 2 positions shown; findings below may reference images not displayed]

FINDINGS: Nonobstructive bowel gas pattern.

Surgical clips at the GE junction.

Degenerative changes of the visualized thoracolumbar spine.
IMPRESSION: No evidence of bowel obstruction.

## 2013-08-19 IMAGING — CR DG CHEST 1V PORT
1 series · 1 of 1 positions shown · non-contrast
Comparison: Chest radiograph 12/30/2011

CLINICAL DATA: Congestive heart failure

PORTABLE CHEST - 1 VIEW

[view not recorded]
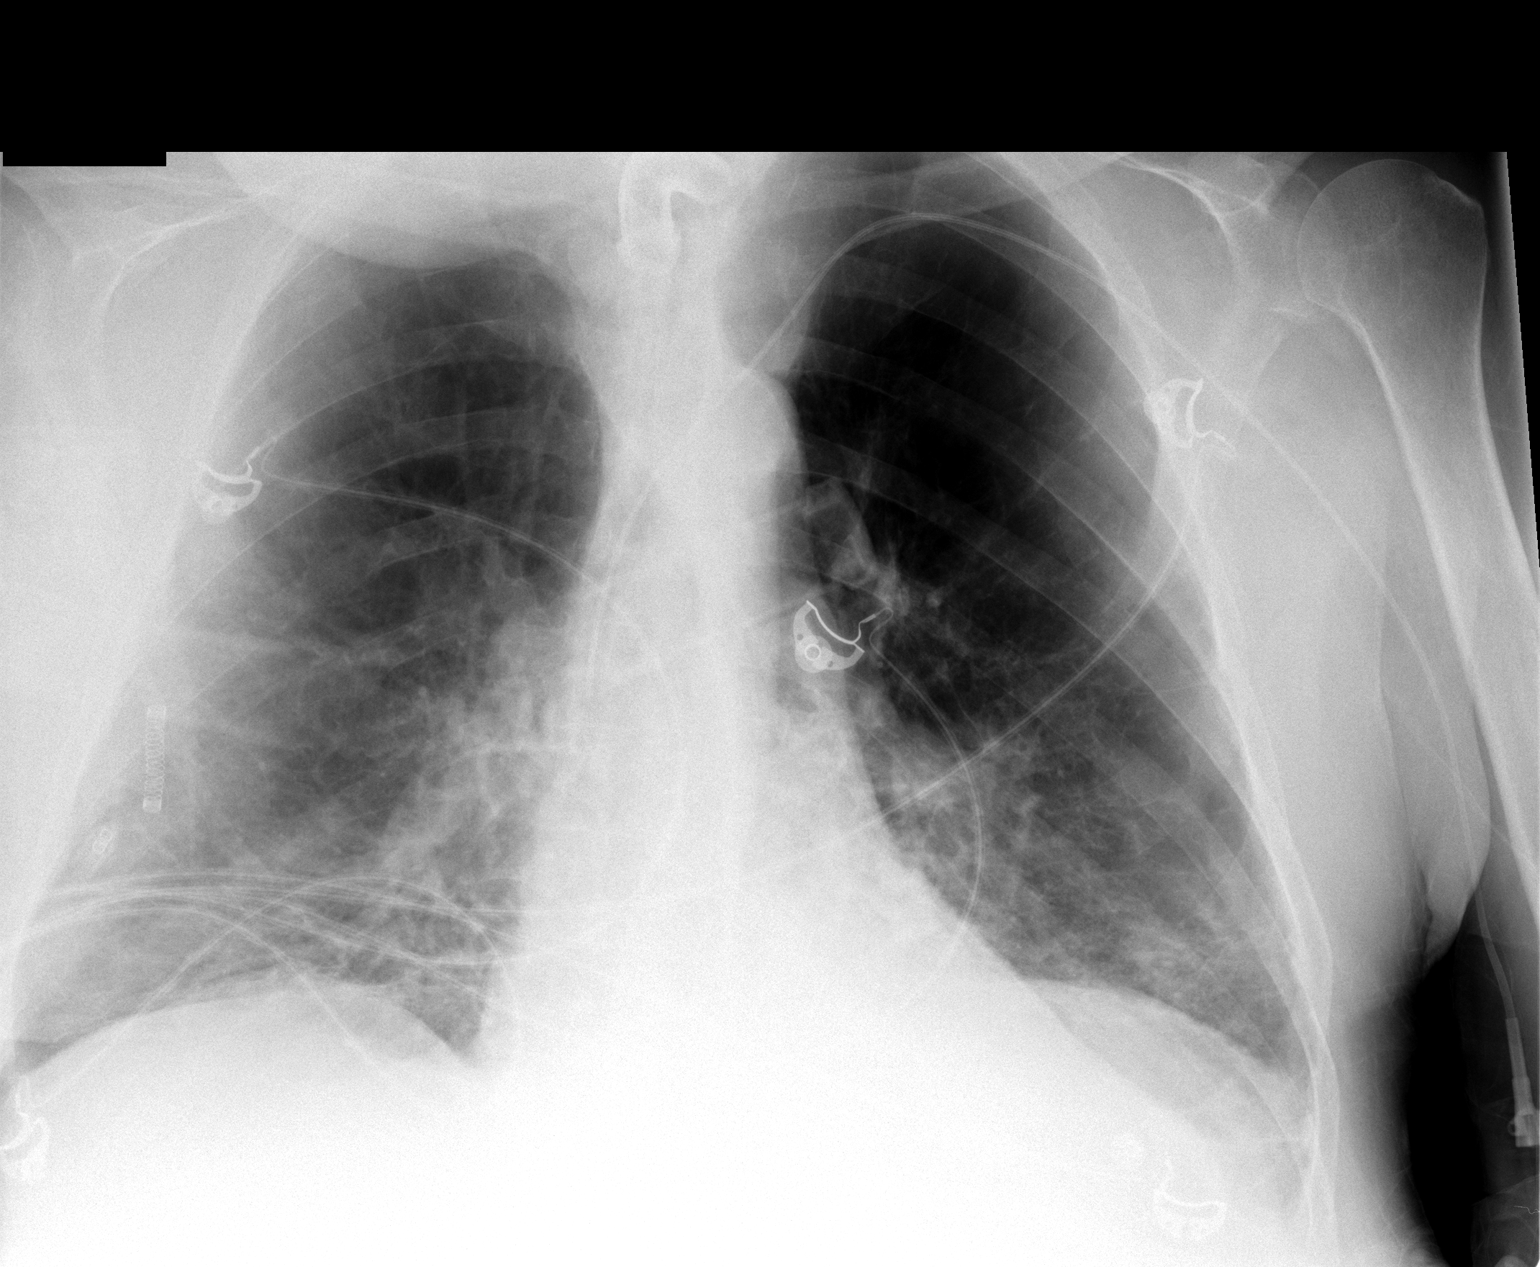

[1 of 1 positions shown; findings below may reference images not displayed]

FINDINGS: Tracheostomy tube and the left PICC line unchanged.
Stable cardiac silhouette.  Lungs are hyperinflated.  There is
improved aeration to the lung bases compared to prior.  No
effusion, infiltrate, or pneumothorax.
IMPRESSION: Improved aeration to the lung bases.

## 2013-09-24 IMAGING — CR DG CHEST 1V PORT
1 series · 1 of 1 positions shown · non-contrast
Comparison: 02/03/2012

CLINICAL DATA: Shortness of breath, difficulty breathing.

PORTABLE CHEST - 1 VIEW

[AP]
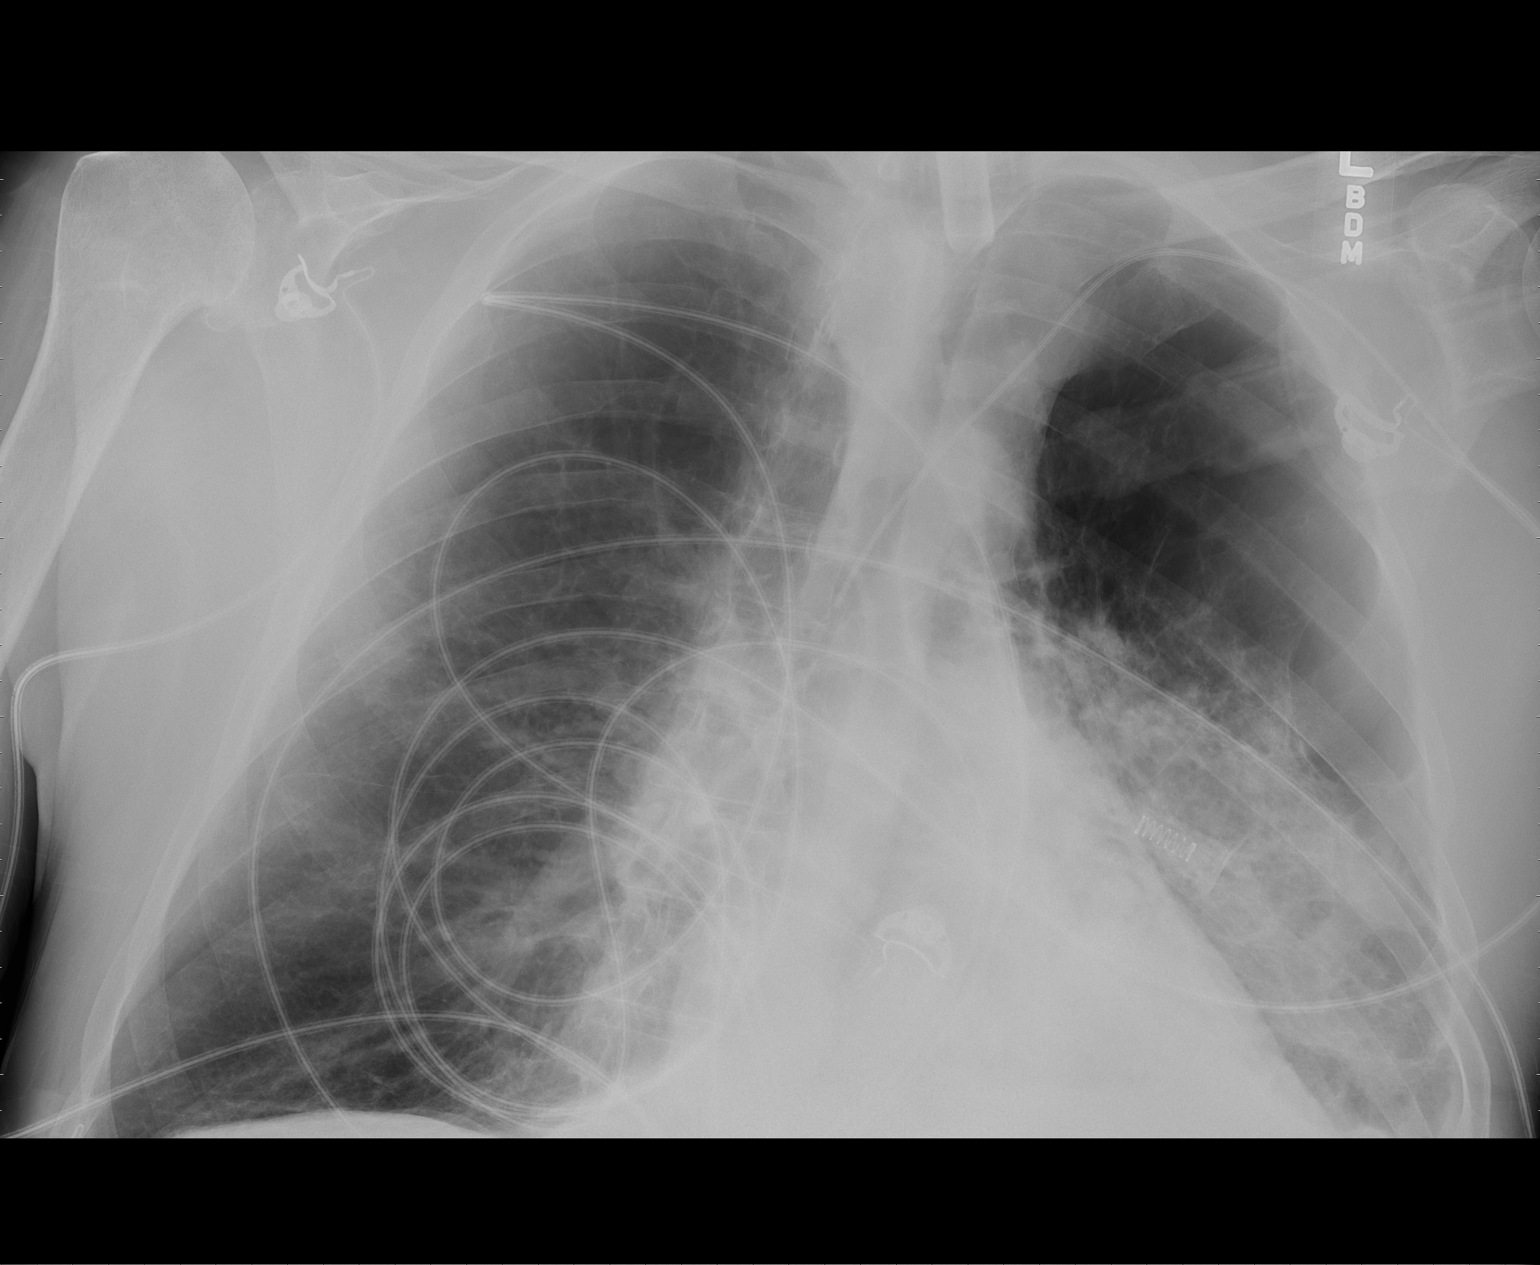

[1 of 1 positions shown; findings below may reference images not displayed]

FINDINGS: Consolidation in the left lower lobe compatible with
pneumonia.  Small left effusion.  Underlying hyperinflation/COPD.
Tracheostomy and left PICC line unchanged.  Heart is borderline in
size.
IMPRESSION: No significant change left lower lung consolidation and small left
effusion.

COPD.

## 2015-01-15 NOTE — H&P (Signed)
PATIENT NAME:  Jeremy Vance, Jeremy Vance MR#:  [id number] DATE OF BIRTH:  05-Jul-1941  DATE OF ADMISSION:  11/25/2011  REFERRING PHYSICIAN: Dr. Carollee MassedKaminski  CHIEF COMPLAINT: Decreased responsiveness and respiratory failure.   HISTORY OF PRESENT ILLNESS: Patient is a 74 year old Caucasian male with history of oxygen-dependent chronic obstructive pulmonary disease, diabetes, hypertension and per wife irregular heart beat who also had congestive heart failure back in 2006 who is currently intubated, sedated in the ED. Information was obtained from ED staff as well as wife, Jeremy Vance. Wife reports that she had upper respiratory infection symptoms about two weeks ago. Patient started to have some cough, decreased p.o. intake, lethargy in the past week or so. Although he did not have any fevers patient felt hot at times. Lately patient has been more lethargic, sleepy and has not eaten much. [place] [facility] [place] [place]. Wife denied patient having any chest pain. This morning patient was noted by wife to be less responsive and EMS was called. Here he was found initially bradycardic to 40s as well as decreased respiration. ABG showed he has significant hypoxic and hypercapnic respiratory failure and he was intubated. Post intubation he had bradycardia and hypotension and possibly had a PA arrest. He was successfully resuscitated after three minutes with injections of atropine and epinephrine x2. He was given IV fluids and currently is on dopamine. Patient also was noted to have hyperkalemia with potassium of 7.4 and given insulin D50, calcium gluconate and some insulin. The hospitalist services were contacted for further evaluation and management.   PAST MEDICAL HISTORY: Per wife: 1. Chronic obstructive pulmonary disease which is "severe" and on around-the-clock oxygen at 2 liters.  2. History of congestive heart failure back in 2006. 3. Diabetes. 4. Hypertension. 5. Hernia repair.  6. History of irregular  heartbeat.   ALLERGIES: None per wife.   SOCIAL HISTORY: He was previous a smoker, quit in 2006. No alcohol or drug use per wife.  FAMILY HISTORY: Unknown.   MEDICATIONS:  1. Lasix 40 mg daily. 2. Vitamin D 50,000 units q. weekly.  3. Diltiazem 360 mg daily.  4. Metformin 1000 mg b.i.d.  5. Lisinopril 20 mg daily.  6. L-Arginine. 7. Formoterol inhaler. 8. Proventil inhaler.   REVIEW OF SYSTEMS: Unable to obtain, patient is intubated and sedated.   PHYSICAL EXAMINATION: GENERAL: Morbidly obese, Caucasian male intubated in bed.   GENERAL: Unkempt, sedated, no obvious distress, spontaneously opens eyes at times.   HEENT: Normocephalic, atraumatic. Pupils are sluggish but equal and reactive. Anicteric sclerae.   NECK: Supple. Unable to evaluate for JVD.   CARDIOVASCULAR: S1, S2, regular rate and rhythm. No murmurs, rubs, or gallops.   LUNGS: Decreased breath sounds bilaterally. No crackles or wheezing.   ABDOMEN: Obese, soft, nondistended, nontender.   EXTREMITIES: No significant lower extremity edema. Left lateral leg has an area of chronic ulceration without any openness or oozing or surrounding cellulitis or drainage.   NEUROLOGIC: Unable to do a full neuro exam given that patient is intubated and sedated.   LABORATORY, DIAGNOSTIC, AND RADIOLOGICAL DATA: BNP 8400, glucose 186, BUN 60, creatinine 2.39, sodium 130, potassium 7.4, chloride 91. LFTs: Albumin 3.2, otherwise within normal limits. CK-MB 9.9, troponin 0.06, WBC 10.8, hemoglobin 14.8, hematocrit 46.3, platelets 263. ABG: pH 7.19, pCO2 102, pO2 66. CT of the head without contrast showing no acute intracranial abnormality. Senescent changes are present. Old left thalamic lacunar infarct, sinus disease. X-ray of the chest: ET tube is in the  right main stem bronchus and this was retracted about 3 cm, atelectasis versus infiltrate within the lung bases.   ASSESSMENT AND PLAN: We have a 74 year old with chronic obstructive  pulmonary disease, hypertension, diabetes on chronic home O2 with possible history of congestive heart failure who presents with acute on chronic respiratory failure, significant hyperkalemia, renal failure currently on pressors, intubated.  1. Acute on chronic respiratory failure likely in the setting of chronic obstructive pulmonary disease exacerbation. Patient appears to have CO2 retention and acidosis. He has decreased breath sounds throughout lung fields. We would consult pulmonary and start the patient on steroids, nebulizers and start the patient on Levaquin and azithromycin and get sputum cultures. Currently he is intubated on AC mode with rate 20, PEEP of 5, FiO2 40%. This appears to be acute hypercapnic with hypoxic respiratory failure in nature. Patient although has history of congestive heart failure he is not volume overloaded grossly on exam. He has elevated BNP; however, but no significant pleural effusions or vascular congestion on the x-ray of the chest. I would get an echo. Hold the Lasix given acute renal failure and monitor the ins and outs strictly. Would also repeat an x-ray of the chest. The lung bases have atelectasis versus pneumonia. He has mild leukocytosis and no fever. We would start the patient on Levaquin and azithromycin as above. patient has had no recent hospitalization per wife.  2. patient has significant hyperkalemia and this is likely in the setting of acute renal failure. There are some prolonged QRS on EKG. He has been given insulin D50, calcium gluconate and Kayexalate. I have ordered another Kayexalate dose and we would repeat the EKG and get a renal consult. I would hold all the nephrotoxins including lisinopril, metformin and Lasix. Patient's renal failure likely is prerenal in the setting of poor p.o. intake +/- medications. I would check urine studies including the eosinophils, omos, lytes and renal ultrasound. Patient is oliguric. He is on some gentle fluids which we  would continue until patient is seen by nephrology. Patient's shock is of unclear etiology currently. This blood pressure drop happened post intubation mostly. Has had 3 liters of IV fluids. Would keep the MAP greater than 65 for now and continue dopamine. He has mildly elevated troponin and this likely is in the setting of demand ischemia from respiratory failure versus less likely ACS. Would cycle troponins, get an echo and lipids and if significant uptrend would consider cards consult. We would start the patient on sliding scale insulin for diabetes.   TOTAL CRITICAL CARE TIME: 70 minutes.   CODE STATUS: FULL CODE.   This was all discussed with the patient's wife, who agrees to this plan. Attempts to transfer the patient to [place] did not go through as [place] was on diversion. He will be admitted to Critical Care Unit.    ____________________________ Krystal Eaton, MD sa:cms D: 11/25/2011 12:28:28 ET T: 11/25/2011 12:43:29 ET JOB#: [id number]  cc: Krystal Eaton, MD, <Dictator> Krystal Eaton MD ELECTRONICALLY SIGNED 11/28/2011 15:38

## 2015-01-15 NOTE — Consult Note (Signed)
Brief Consult Note: Diagnosis: GI conuslt for peg tube.   Patient was seen by consultant.   Consult note dictated.   Comments: Pt is having liquid diarrhea. Stool for C difficele pending. Pt reports stomach ache off/on. Tolerates TF well. Lungs are clear. A fib with rapid ventricular rate 120-136. Plans for trach placement tomorrow. Will discuss timing of Peg tube placement w Dr. Mechele CollinElliott.  Electronic Signatures: Rowan BlaseMills, Kairi Harshbarger Ann (NP)  (Signed 18-Mar-13 14:18)  Authored: Brief Consult Note   Last Updated: 18-Mar-13 14:18 by Rowan BlaseMills, Antino Mayabb Ann (NP)

## 2015-01-15 NOTE — Consult Note (Signed)
Pt has converted to sinus rhythm.  He got a trach inserted today.  NG tube placed after oral gastric tube removed.  Abd very distended, soft and tympanitic.  Plan for PEG tomorrow.    Electronic Signatures: Scot JunElliott, Casi Westerfeld T (MD)  (Signed on 19-Mar-13 18:17)  Authored  Last Updated: 19-Mar-13 18:17 by Scot JunElliott, Allessandra Bernardi T (MD)

## 2015-01-15 NOTE — Consult Note (Signed)
Pt seen, daughter interviewed and PEG discussed with her.  Plan for Wed afternoon to do this at the bed side in CCU if Dr. Katrinka BlazingSmith or another  surgeon is available to participate.  Complications such as bleeding, tube malfunction,  anatomy preventing successful placement were discussed.  Abd exam shows no large or long surgical scars and no HSM.   Electronic Signatures: Scot JunElliott, Hildreth Orsak T (MD)  (Signed on 18-Mar-13 17:07)  Authored  Last Updated: 18-Mar-13 17:07 by Scot JunElliott, Izeyah Deike T (MD)

## 2015-01-15 NOTE — Discharge Summary (Signed)
PATIENT NAME:  Jeremy Vance, Jeremy Vance MR#:  [id number] DATE OF BIRTH:  09-Jul-1941  DATE OF ADMISSION:  11/25/2011 DATE OF DISCHARGE:  12/26/2011   Please take a look at the previous interim discharge summaries done by Dr. Luberta MutterKonidena which covers hospital course from March 4th until March 15th and also look at the interim discharge summary done by Dr. Jacques NavyAhmadzia which covers hospital course from March 15th onwards until March 22nd, and also interim summary done by Dr. Dava NajjarPanwar which covers hospital course from March 22nd onwards until March 29th. This covers hospital course from March 30th onwards to day of discharge, April 4th.    DIAGNOSES PRESENTLY: 1. Acute on chronic respiratory failure due to chronic obstructive pulmonary disease exacerbation with underlying pneumonia.  2. Pneumonia status post complete antibiotic treatment.  3. Underlying chronic obstructive pulmonary disease. 4. Right lower extremity swelling with no evidence of underlying deep vein thrombosis. 5. Acute renal failure, now resolved.  6. Leukocytosis, also treated and resolved.  7. History of PEA cardiac arrest with successful cardiopulmonary resuscitation with no further acute abnormalities.  8. Atrial fibrillation but currently in sinus rhythm.  9. Diabetes.  10. Anemia of chronic disease.   CONSULTANTS DURING THE HOSPITAL COURSE: 1. Dr. Belia HemanKasa from Pulmonary Critical Care   2. Dr. Linus Salmonshapman McQueen from Ear, Nose, and Throat  3. Dr. Gilda CreaseSchnier from Vascular Surgery   4. Dr. Katrinka BlazingSmith and Dr. Mechele CollinElliott from GI   PROCEDURES DONE DURING THE HOSPITAL COURSE:  1. Tracheostomy. 2. PEG tube placement.    CURRENT MEDICATIONS:  1. Combivent inhaler 8 puffs q.4 hours. 2. Flonase 2 puffs q.12 hours.  3. Nystatin to be applied to the affected area b.i.d.  4. Ranitidine 150 mg b.i.d.  5. Xanax 0.5 mg t.i.d. at 6 o'clock, 12 o'clock, and 6 o'clock in the evening. 6. Tylenol 650 q.4 hours p.r.n.  7. Hydralazine 10 mg q.6 hours p.r.n. systolic blood  pressure greater than 170. 8. Cardizem 10 mg IV push for heart rate greater than 130 as needed.  9. Senokot 10 mL b.i.d.  10. Aspirin 325 mg daily.  11. Metoprolol 25 mg b.i.d.  12. Oxycodone 5 mg q.6 hours p.r.n.  13. Lovenox 40 mg q.24 hours.  14. Lantus insulin 30 units daily.  15. Trazodone 25 to 50 mg at bedtime as needed.  16. Amiodarone 400 mg daily.  17. NovoLog insulin sliding scale with fingersticks q.6 hours. 18. Simethicone 80 mg q.4 hours p.r.n.  19. Colace 200 mg b.i.d.  20. Chloraseptic spray two sprays orally q.6 hours p.r.n.   PERTINENT STUDIES DONE DURING THIS INTERIM HOSPITAL COURSE: Currently none.   BRIEF HOSPITAL COURSE: This is a 74 year old male with multiple medical problems as mentioned above who presented to the hospital secondary to respiratory failure from COPD exacerbation.  1. Acute on chronic respiratory failure in the setting of COPD exacerbation and underlying pneumonia. During this past interim course, the patient has remained on the vent on 35% FiO2 and difficult to wean. He has been maintained on Combivent, Flovent nebs, finished a prednisone taper. He continues to be a difficult person to wean off the vent and, therefore, he is being discharged to a long-term facility for further care. The patient has been followed by Pulmonary by Dr. Belia HemanKasa who has been attempting to wean the patient without much success.  2. Pneumonia, likely the cause of the patient's underlying COPD exacerbation. The patient's sputum culture as of March 8th grew out Enterococci and Staph. The patient was on  Unasyn. He has finished antibiotic therapy for his pneumonia. He currently remains afebrile, hemodynamically stable. His white cell count has come down. There were, therefore, no acute issues related to this.  3. Chronic obstructive pulmonary disease. The patient is currently being maintained on Combivent, Flovent, and the patient has finished a steroid taper as mentioned.  4. Right  lower extremity swelling. The patient has had an ultrasound of his lower extremities which shows no evidence of any DVT. He is clinically stable from that standpoint.  5. Acute renal failure. This is likely related to ATN and prerenal azotemia and use of diuretics. The patient's creatinine is now back to baseline. His renal ultrasound was unremarkable.  6. Leukocytosis. This was in the setting of pneumonia and respiratory failure. This has also improved much. His white cell count last checked on April 2nd was 8.3.  7. Atrial fibrillation. The patient was in rapid atrial fibrillation during his hospital course but during this past week has remained in a sinus rhythm. He is currently being maintained on amiodarone and Lopressor. There have been no acute issues related to rate control during this past week. The patient's echocardiogram showed pulmonary hypertension with some diastolic dysfunction.  8. History of PEA arrest status post cardiopulmonary resuscitation. This happened in the setting of hypercarbic respiratory failure as well as underlying hyperkalemia when he presented. During this past week the patient has had no evidence of any ectopy or arrhythmia. He is being maintained on his beta-blockers as mentioned and remains in a sinus rhythm.  9. Diabetes. The patient is on tube feedings. Blood sugars have been under fair control. He is currently on Lantus and sliding scale coverage as mentioned which we are continuing. His blood sugars have remained stable.  10. Chronic anemia. This was likely anemia of chronic disease. During this past week I did transfuse him 1 unit of packed red blood cells as his hemoglobin was down to 7.9. Posttransfusion it has remained stable. His last hemoglobin was 8.7 as of April 2nd. There has been no evidence of acute bleeding as mentioned.   Due to the patient's chronic respiratory failure and difficult to wean off the ventilator, the patient currently is now being  transferred to a long-term acute care facility for further weaning and rehab.   CODE STATUS: The patient is a FULL CODE.   Family is in agreement with transfer to [facility]. The patient is being discharged to [facility].        TIME SPENT WITH THE DISCHARGE: 40 minutes.   ____________________________ Rolly Pancake. Cherlynn Kaiser, MD vjs:drc D: 12/26/2011 08:14:33 ET T: 12/26/2011 08:43:41 ET JOB#: [id number]  cc: Rolly Pancake. Cherlynn Kaiser, MD, <Dictator> Houston Siren MD ELECTRONICALLY SIGNED 12/27/2011 14:33

## 2015-01-15 NOTE — Consult Note (Signed)
Pt noted to have a lot of gaseous distention yesterday and today.  Dr. 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  Will repeat evaluation tomorrow to see if can do PEG then or Friday.  Electronic Signatures: Scot JunElliott, Oyindamola Key T (MD)  (Signed on 20-Mar-13 17:04)  Authored  Last Updated: 20-Mar-13 17:04 by Scot JunElliott, Kiley Solimine T (MD)

## 2015-01-15 NOTE — Consult Note (Signed)
PATIENT NAME:  Jeremy Vance, Jeremy Vance MR#:  [id number] DATE OF BIRTH:  [birth year]  DATE OF CONSULTATION:  12/10/2011  REFERRING PHYSICIAN:  Lynnae Prude, MD CONSULTING PHYSICIAN:  Adella Hare, MD  HISTORY OF PRESENT ILLNESS: This 74 year old male for the purposes of surgery consultation has a chief complaint of inability to swallow. He has recently been admitted emergently to the hospital with acute respiratory failure and hemodynamic shock. He had had a recent illness in February with protracted coughing and has chronically been short of breath and oxygen dependent. He was admitted emergently on 03/04 with acute respiratory failure and cardiac arrest. He was very hypoxic and hypercapnic and required emergency intubation. He also appeared to be in acute renal failure. He was admitted emergently to the Intensive Care Unit and has been maintained on the ventilator. He has received orogastric feedings and due to failure to wean from the ventilator had a tracheostomy done today. Dr. Mechele Collin called me with request for consultation to consider percutaneous endoscopic gastrostomy.   PAST MEDICAL HISTORY:   1. Severe chronic obstructive pulmonary disease, has been on oxygen for several years.   2. History of congestive heart failure.  3. Diabetes.  4. Hypertension.  5. Atrial fibrillation.   PAST SURGICAL HISTORY: Right inguinal hernia repair.   MEDICATIONS: At the time of admission included Lasix, vitamin D, diltiazem, metformin, lisinopril, l-arginine, formoterol inhaler, Proventil inhaler.   CURRENT HOSPITAL MEDICATIONS:  1. Midazolam drip. 2. Precedex drip.  3. Amiodarone drip.  4. Norepinephrine drip.  5. Combivent inhaler. 6. Chlorhexidine oral treatments. 7. Fluticasone inhaler.  8. Nystatin topical powder.  9. Ranitidine.  10. Alprazolam.  11. Amiodarone tablet. 12. Methylprednisolone injection.  13. Acetaminophen tablet. 14. Hydralazine  injection. 15. Metoprolol. 16. Unasyn. 17. Lovenox.  18. Diltiazem injections p.r.n.  19. Reglan IV injections q. 6 hours.  20. Docusate sodium. 21. Lactulose.  22. Insulin coverage.  23. Morphine p.r.n.     ALLERGIES: None known.   SOCIAL HISTORY: He has smoked in the past but quit smoking in 2006. Has subsequently been on oxygen therapy. Does not drink any alcohol.   FAMILY HISTORY: Unknown.  REVIEW OF SYSTEMS:  The patient has been sedated in recent days, has been unaware of some of the circumstances. He reports he is breathing reasonably comfortably at present, not having any chest pain. He has had some recent abdominal distention and constipation.  He has received lactulose and eventually has developed some diarrhea and just has recently had a Flexi-Seal rectal tube inserted. He has had adequate urinary output. He has been confined to bed so he has not been not doing any walking since admission. He has not had any sores or boils. Has developed some swelling of the right hand. Review of systems otherwise negative.   PHYSICAL EXAMINATION:  GENERAL: He is awake and alert, just cannot speak due to the tracheostomy but is able to wiggle toes of both feet and also could squeeze my fingers bilaterally and can nod indicating either yes or no.  He is awake and alert and appears to be aware of circumstances according to his responses.    VITAL SIGNS: Temperature 98.7, pulse 84, respirations 24, blood pressure 93/50, pulse oximetry 98%   SKIN: Warm and dry.   HEENT: Pupils are equal and reactive to light. Extraocular movements are intact. Sclerae clear. He has some mucus and saliva in his mouth. However, when asked to have him swallow he could swallow this and clear his mucus and saliva  from his mouth. Parts of the tongue appeared to be somewhat dry.   NECK: A tracheostomy tube is in place. Also had a central venous catheter with dressing intact.   LUNGS: Lung sounds are clear.   HEART:  Regular rhythm, S1 and S2 without murmur.   ABDOMEN: Morbidly obese and distended, tympanitic.  There is no palpable hernia. There is no tenderness, no localized palpable mass.   GU/RECTAL: He has an indwelling Foley catheter and indwelling rectal tube.   EXTREMITIES:  He has some edema of his right hand but not of the left. I do not see any ankle edema, just has a trace of leg edema bilaterally.   NEURO: He is awake and alert and appears to be aware of circumstances. Facial expression is symmetrical and he is moving all extremities   CLINICAL DATA: His blood sugars have been monitored and are generally staying below 279. His latest BUN is 41, creatinine 0.6, white blood count 17,000, hemoglobin 13.1, platelet count 162,000.   I reviewed the x-ray of his abdomen which was done last week which demonstrated some gaseous distention of the colon and also a significant amount of stool in the colon. Otherwise normal bowel gas pattern. Today's x-ray demonstrated atelectasis versus pneumonia in the left lower lobe, and trace left pleural effusion.  His albumin on admission was 3.2, and that was on 03/04. It has not been checked since then.   IMPRESSION:  1. Acute respiratory failure secondary to pneumonia and severe chronic obstructive pulmonary disease. 2. Recent atrial fibrillation.  3. Recent severe hemodynamic shock.  4. Morbid obesity.  5. Ileus.   I discussed with the patient and the wife percutaneous endoscopic gastrostomy. I discussed the technique, risks and benefits, and also the alternative of laparotomy, the alternative of laparoscopy, and the alternative of nonoperative management, and the option of speech therapy evaluation to assess swallowing.   The patient and wife are interested in having him get the feeding tube inserted.   I discussed this with the nurses and we will plan to do an x-ray of the abdomen in the morning to recheck his gas pattern, and if that is reasonable proceed  with a percutaneous endoscopic gastrostomy towards the end of the day tomorrow. He is already on the antibiotic Unasyn and anticipate he will be receiving that even through the period of the time of surgery.    ____________________________ Shela CommonsJ. Renda RollsWilton Madeleine Fenn, MD jws:bjt D: 12/10/2011 21:04:57 ET T: 12/11/2011 09:59:51 ET JOB#: [id number]  cc: Adella HareJ. Wilton Franciszek Platten, MD, <Dictator> Adella HareWILTON J Solene Hereford MD ELECTRONICALLY SIGNED 12/12/2011 20:22

## 2015-01-15 NOTE — Op Note (Signed)
PATIENT NAME:  Jeremy PlumberDURHAM, Dvonte MR#:  [id number] DATE OF BIRTH:  07/17/1941  DATE OF PROCEDURE:  12/13/2011  PREOPERATIVE DIAGNOSIS: Dysphagia.   POSTOPERATIVE DIAGNOSIS: Dysphagia.   PROCEDURE: Percutaneous endoscopic gastrostomy.   SURGEON: Renda RollsWilton Smith, MD   GASTROENTEROLOGIST: Kerrin MoBob Elliott, MD   ANESTHESIA: Intravenous sedation.   INDICATIONS: This 74 year old male was admitted to the hospital with respiratory failure, has been ventilator-dependent and further treated with creation of a tracheostomy, now having some dysphagia which appears to be associated with the tracheostomy, needing gastrostomy for nutritional  support.   DESCRIPTION OF PROCEDURE: The patient was in his Critical Care Unit bed and was being monitored with blood pressure recordings and cardiac monitor and oximetry. He was sedated. Dr. Mechele CollinElliott began the procedure with upper endoscopy, and after a survey of the esophagus, stomach and duodenum, the patient was rolled into the supine position and then the bed was placed in the reversed Trendelenburg position. The light of the endoscope could be seen shining through the abdominal wall in the left upper quadrant just about 2 inches below the costal margin several inches from the midline; and I pressed in with a finger, and we could see the finger imprint on the video monitor. Next, the left upper quadrant was prepared with alcohol solution and subsequently with Betadine and allowed to dry, draped in a sterile manner. The skin was infiltrated with 1% Xylocaine. Next, a incision was made large enough to admit the gastrostomy tube, and subsequently four-point fixation was carried out with insertion of four Brown-Mueller T-fasteners. These were placed approximately 2.5 cm apart surrounding the short incision. Each suture was anchored to the skin with cotton balls and crimps. Next, a Seldinger needle was inserted into the stomach, and the guidewire advanced into the stomach, and the needle  withdrawn. The tract was serially dilated and ultimately inserted an 18-gauge Magna-Port gastrostomy feeding tube. I inflated the balloon with 15 mL of water. Traction was applied as the balloon was pulled up adjacent to the abdominal wall. Next, Dr. Mechele CollinElliott decompressed the stomach and then subsequently withdrew the endoscope. Next, the accompanying disk was advanced down to the cotton balls and sutured to the skin with 0 silk sutures, four-point fixation. Bleeding was very scant during the procedure. Dressings were applied with benzoin and 2-inch silk tape. The tube was connected to a biliary drainage bag.   The patient tolerated the procedure satisfactorily and remained in the Critical Care Unit for his continued care.   ____________________________ J. Renda RollsWilton Smith, MD jws:cbb D: 12/13/2011 18:13:26 ET T: 12/14/2011 13:20:03 ET JOB#: [id number]  cc: Adella HareJ. Wilton Smith, MD, <Dictator> Adella HareWILTON J SMITH MD ELECTRONICALLY SIGNED 12/15/2011 12:40

## 2015-01-15 NOTE — Consult Note (Signed)
PATIENT NAME:  Jeremy Vance, Jeremy Vance MR#:  [id number] DATE OF BIRTH:  Sep 14, 1941  DATE OF CONSULTATION:  12/09/2011  REFERRING PHYSICIAN:  Krystal EatonShayiq Ahmadzia, MD CONSULTING PHYSICIAN:  Ranae PlumberKimberly A. Arvilla MarketMills, ANP for [name], M.D.    REASON FOR CONSULTATION: PEG placement.   HISTORY OF PRESENT ILLNESS: This 74 year old patient was admitted to the Intensive Care Unit 11/25/2011 for decreased responsiveness and respiratory failure. The daughter thinks he had pneumonia a couple of weeks prior to admission. He had been acting lethargic, not eating much, and less responsive. EMS found bradycardia, decreased respirations with significant hypoxia, hypercapnic respiratory failure, and he was intubated. He developed bradycardia, hypotension, and possible PA arrest. He was resuscitated with atropine and epinephrine, given IV fluids, and placed on pressors. The patient was found to have hyperkalemia. He has been subsequently intubated, and has shown improvement with wakefulness, still has tachycardia and remains on amiodarone, metoprolol and has been in atrial flutter. He has had elevated troponins thought to be related to demand ischemia, cannot rule out underlying coronary artery disease. The patient did have a cardiac arrest on admission complicated by acute renal failure, acute metabolic encephalopathy, and shock.   Currently, the patient is awake, tolerating tube feeds, developing diarrhea according to the daughter. He has denied abdominal pain, and plans for tracheostomy tomorrow with request for PEG tube placement.   PAST MEDICAL HISTORY: 1. Chronic obstructive pulmonary disease, severe, on chronic oxygen therapy 2 liters at home.  2. History of congestive heart failure.  3. Diabetes mellitus.  4. Hypertension.  5. Hernia repair.  6. History of irregular heartbeat.   MEDICATIONS ON ADMISSION: 1. Lasix 40 mg daily.  2. Vitamin D 50,000 units weekly.  3. Diltiazem 360 mg daily.  4. Metformin 1000 mg twice  daily.  5. Lisinopril 20 mg daily.  6. L-arginine daily.  7. Formoterol inhaler.  8. Proventil inhaler.   ALLERGIES: No known allergies listed.   REVIEW OF SYSTEMS: The patient says no to any pain. Positive diarrhea. Otherwise unable to obtain, patient intubated but awake.   PHYSICAL EXAMINATION: VITAL SIGNS: Heart rate 128 to 136, blood pressure 128/60, temperature around 98.4 to 99.8 range. Respirations are 23.   GENERAL: Obese, critically ill-looking Caucasian male resting in bed.   HEENT: Head is normocephalic. The patient slightly looks at speaker.   NECK: Supple, large size.   CARDIOVASCULAR: S1 and S2. Tachycardia noted.   LUNGS: Anterior clear to auscultation.   ABDOMEN: Soft, nondistended. Bowel sounds present. Obese. He denies tenderness with palpation.   RECTAL: Deferred. Incontinent of brown loose liquid stool.  EXTREMITIES: With brawny discoloration, lateral leg area of chronic ulceration without drainage. Edema bilateral legs and arms.   NEUROLOGIC: The patient squeezes hands, looked at speaker. Nods head yes and no. Does not try to mouth words. Minimally responsive. Nods yes that he is comfortable. Eyes close again.   LABORATORY, DIAGNOSTIC, AND RADIOLOGICAL DATA: Pertinent labs today with glucose 276, BUN 37, creatinine 0.66. WBC 21.2, hemoglobin 13.9, platelet count 178,000. Urine culture negative growth. Indwelling catheter.   RADIOLOGY: Recent chest x-ray, single view, 12/09/2011 shows support lines and tubing unchanged position, improved aeration of the left lung. Persistent left lower lobe airspace disease, which may represent atelectasis versus pneumonia, trace left pleural effusion. Heart and mediastinum are stable. This is improvement from bilateral atelectasis 12/02/2011. He had a KUB x-ray done 12/03/2011 that showed NG tube present. No bowel obstruction but moderate large amount of fecal material in the colon. The patient  has been receiving stool softeners  and laxative therapy.   IMPRESSION: This 74 year old complex medical patient with acute on chronic chronic obstructive pulmonary disease exacerbation thought to be from bilateral pneumonia in conjunction with acute cardiac arrest, renal failure, and metabolic encephalopathy and shock. He has continued to fail vent wean, plan for trach tomorrow. There is a request for PEG tube placement.  The patient has an abdomen that is soft, no significant scarring noted. He has incontinence of stool but looking through the chart, he has had problems with constipation and has been receiving laxative therapy, likely etiology. Nonetheless, Clostridium difficile has been ordered, as the patient is on ongoing antibiotic therapy.  Timing of PEG tube tentatively on Wednesday, will see how he does with the trach. The patient does have elevated leukocytosis and multiple ongoing medical problems.  Dr. Mechele Collin was in to evaluate, and collaborated on care.  These services provided by Cala Bradford A. Arvilla Market, ANP under collaborative agreement with Scot Jun, MD.  Wife is  not available for further information.  [facility] review does not show previous luminal evaluation.     ____________________________ Ranae Plumber Arvilla Market, ANP kam:kma D: 12/09/2011 18:14:20 ET T: 12/10/2011 09:43:24 ET JOB#: [id number] cc: Cala Bradford A. Arvilla Market, ANP, <Dictator> Ranae Plumber. Suzette Battiest, MSN, ANP-BC Adult Nurse Practitioner ELECTRONICALLY SIGNED 12/10/2011 11:42

## 2015-01-15 NOTE — Consult Note (Signed)
Spoke to Dr. Katrinka BlazingSmith and plan to do PEG at 5-5:30 today.  Electronic Signatures: Scot JunElliott, Tyerra Loretto T (MD)  (Signed on 22-Mar-13 11:46)  Authored  Last Updated: 22-Mar-13 11:46 by Scot JunElliott, Dent Plantz T (MD)

## 2015-01-15 NOTE — Consult Note (Signed)
PATIENT NAME:  ALI, MOHL MR#:  [id number] DATE OF BIRTH:  12-26-1940  DATE OF CONSULTATION:  11/25/2011  REFERRING PHYSICIAN:  Vivien Presto, MD CONSULTING PHYSICIAN:  Shreyas Piatkowski Lilian Kapur, MD  REASON FOR CONSULTATION: Acute renal failure, hyperkalemia.   HISTORY OF PRESENT ILLNESS: The patient is a 74 year old Caucasian male with past medical history of chronic obstructive pulmonary disease, diabetes mellitus, hypertension, and vitamin D deficiency who presented to [facility] earlier today with respiratory failure. The patient is unable to offer any history at this point in time as he is currently intubated and sedated. History is obtained through chart review and discussion with the hospitalist. It appears that the patient has been ill over the past 1 to 2 weeks. He has had increasing lethargy, weakness and has also had diminished p.o. intake. He was urged to go to the [facility] yesterday, but he refused to go. Today the patient was found unresponsive by his wife. Upon presentation here, the patient was found to have respiratory failure with a pH of 7.19, pCO2 of 102, and pO2 of 66. He was subsequently intubated. He then suffered pulseless electrical activity arrest and received cardiopulmonary resuscitation for three minutes in the emergency department. He was resuscitated and then brought to the Critical Care Unit. He has received 3 liters of fluid thus far. He was also found to be significantly hyperkalemic and for this he received insulin 10 units, D50, as well as calcium infusion. Labs were repeated and his potassium is now down to 5.8.   PAST MEDICAL HISTORY:  1. Chronic obstructive pulmonary disease.  2. Diabetes mellitus.  3. Hypertension.  4. Morbid obesity.  5. Vitamin D deficiency   PAST SURGICAL HISTORY: Unable to determine at this point in time   SOCIAL HISTORY: Unable to determine at this point in time as the patient is currently intubated and sedated and  no family is available at present.   FAMILY HISTORY: Unable to obtain at this point in time as the patient is intubated and sedated.  REVIEW OF SYSTEMS: Unable to obtain at this point in time as the patient is currently intubated and sedated.   ALLERGIES: No known drug allergies.   CURRENT INPATIENT MEDICATIONS:  1. Dopamine drip. 2. Normal saline 100 mL/h. 3. Azithromycin 500 mg IV every 24 hours. 4. Lovenox 40 mg subcutaneous daily. 5. Fluticasone 220 mcg two puffs inhaled twice a day.  6. Sliding scale insulin.  7. Levofloxacin 750 mg IV every 48 hours.  8. Solu-Medrol 80 mg IV every 6 hours. 9. Protonix 40 mg IV every 6 a.m.  10. Kayexalate 30 grams via nasogastric tube x1.   PHYSICAL EXAMINATION:   VITAL SIGNS: Temperature 94.5, pulse 82, respirations 20, blood pressure 107/47, and pulse oximetry 92%. The patient is intubated.   GENERAL: Critically ill appearing Caucasian male.   HEENT: Normocephalic, atraumatic. Unable to assess extraocular movements at present. Pupils were 5 mm and sluggish to react bilaterally. No scleral icterus. Conjunctivae are pink. No epistaxis noted. Endotracheal tube in place. OG tube in place also.   NECK: Short and thick, but supple. No obvious jugular venous distention.   LUNGS: Bilateral wheezing as well as rhonchi. Respirations currently vent-assisted.   HEART: S1 and S2, regular rate and rhythm. No obvious murmurs or rubs are heard.   ABDOMEN: Obese, soft, nontender, and nondistended. Bowel sounds positive. No rebound or guarding. No gross organomegaly appreciated.   EXTREMITIES: No clubbing or cyanosis noted. Trace bilateral lower extremity  edema noted.   SKIN: Warm and dry. No acute rashes noted, but skin is quite dry.   GU: No suprapubic tenderness is noted at this time.   NEUROLOGIC: The patient is currently intubated and sedated, not currently responding to painful stimuli.   PSYCHIATRIC: Unable to assess at this time.    LABS/STUDIES: Sodium 138, potassium 5.8, chloride 97, CO2 30, BUN 58, creatinine 2.5, glucose 153.   CT of head showed no acute intracranial abnormality. Old left thalamic lacunar infarct noted.  Chest x-ray shows endotracheal tube noted along the course of the right main stem bronchus. Retraction of approximately 2 to 2.5 cm is recommended. Atelectasis versus infiltrate within the lung base is noted.  Troponin 0.06. CBC shows WBC 10.8, hemoglobin 14.8, hematocrit 46, platelets 2863. BNP 8400.   IMPRESSION: This is a 74 year old Caucasian male with past medical history of diabetes mellitus, hypertension, chronic obstructive pulmonary disease, morbid obesity, and vitamin D deficiency who presented to [facility] with respiratory failure and also found to have acute renal failure with hyperkalemia.   PROBLEM LIST:  1. Acute renal failure.  2. Hyperkalemia.  3. Respiratory failure.  4. Respiratory acidosis.  5. Hypotension.   PLAN: The patient presents with critical illness at this point in time. It appears that this was all precipitated by respiratory failure and the patient had short PEA arrest. His acute renal failure is likely multifactorial, however, he likely has underlying acute tubular necrosis from his critical illness. Agree with plans for renal ultrasound. Continue IV fluid hydration, as being performed. We will check SPEP, UPEP, ANA, ANCA, and GBM antibodies. No acute indication for dialysis at this point in time. In regards to his hyperkalemia, the patient's potassium was 7.4, however, with shifting maneuvers his potassium is now down to 5.8. I suspect that this will continue to improve as his acidosis improves. I agree with repeat ABG later today. In addition we will obtain repeat BMP to ensure that his potassium is coming down. I agree with mechanical ventilation for treatment of his respiratory failure at present to improve his respiratory acidosis. In regards  to his hypotension, I would obtain a 2-D echocardiogram to further evaluate his cardiac status. I agree with use of dopamine for now to augment blood pressure. We recommend a map of 65 or greater to maintain renal perfusion. We would certainly avoid nephrotoxins as well as IV contrast, at this point in time. We will continue to monitor the patient's progress closely during the course of his hospitalization. His prognosis appears quite guarded at this point in time. I would like to thank Dr. Vivien Presto for this kind referral. Further plan as the patient progresses. ____________________________ Tama High, MD mnl:slb D: 11/25/2011 13:19:05 ET T: 11/25/2011 13:36:24 ET JOB#: [id number]  cc: Tama High, MD, <Dictator>  Mariah Milling Ulyses Panico MD ELECTRONICALLY SIGNED 12/18/2011 0:47

## 2015-01-15 NOTE — Consult Note (Signed)
PATIENT NAME:  Jeremy Vance, Jeremy MR#:  [id number] DATE OF BIRTH:  1941/04/13  DATE OF CONSULTATION:  11/27/2011  REFERRING PHYSICIAN:   CONSULTING PHYSICIAN:  Laurier NancyShaukat A. Nancyann Cotterman, MD  INDICATION FOR CONSULTATION: Atrial fibrillation with rapid ventricular response rate.  HISTORY OF PRESENT ILLNESS:  This is a 74 year old Caucasian male with a past medical history of severe chronic obstructive pulmonary disease, diabetes, and hypertension who has a history of intermittent congestive heart failure and paroxysmal atrial fibrillation.  He came in to the hospital, was unresponsive and in respiratory failure, initially was bradycardiac, and was intubated. He also was hypotensive. He was given IV fluids. His initial potassium was 7.5. He was given D50 and calcium gluconate and now has developed atrial fibrillation with rapid ventricular response rate of 140. Thus, I was asked to evaluate the patient.  The patient remains intubated, unable to get much detailed history except from his wife. His wife states according to the past medical history he has severe chronic obstructive pulmonary disease. He is on 2 liters nasal cannula oxygen at home, history of congestive heart failure since 2006, and history of paroxysmal atrial fibrillation also in 2006, history of hypertension.  No history of myocardial infarction or coronary artery disease.   ALLERGIES: None. As per the wife she does not remember him having any allergies.   SOCIAL HISTORY:  He quit smoking in 2006. No EtOH abuse.   MEDICATIONS: Lasix, diltiazem, metformin, lisinopril, and Proventil inhaler.   PHYSICAL EXAMINATION:  GENERAL: He is alert and oriented times 0, intubated.  VITAL SIGNS: Blood pressure 120/70, respirations 20. The monitor shows a heart rate of 118, atrial fibrillation.   HEENT:  Positive jugular venous distention.   LUNGS: Good air entry.   HEART: Irregularly irregular. Normal S1, S2. No audible murmur.   ABDOMEN: Soft, nontender,  positive bowel sounds.   EXTREMITIES: No pedal edema.   LABS/STUDIES: EKG shows atrial fibrillation, rate 140 per minute, right bundle branch block, nonspecific ST-T changes.   His BUN/creatinine is 35/1.17. Hemoglobin/hematocrit 12.9/39.5. ABG: pH is 7.51, pCO2 39,  pO2 84.   ASSESSMENT AND PLAN: Atrial fibrillation with rapid ventricular response rate. He has been  getting digoxin and Cardizem IV. He has not responded well. Advise discontinuation of digoxin and Cardizem and start IV amiodarone as per protocol including loading dose. Also we will get an echocardiogram and evaluate further management based on his ejection fraction. Thank you very much for the referral.    ____________________________ Laurier NancyShaukat A. Rustyn Conery, MD sak:bjt D: 11/27/2011 13:52:15 ET T: 11/27/2011 14:15:13 ET JOB#: [id number]  cc: Laurier NancyShaukat A. Jenne Sellinger, MD, <Dictator> Laurier NancySHAUKAT A Aradhana Gin MD ELECTRONICALLY SIGNED 12/02/2011 8:59

## 2015-01-15 NOTE — Op Note (Signed)
PATIENT NAME:  Jeremy Vance, Jeremy Vance MR#:  [id number] DATE OF BIRTH:  Sep 11, 1941  DATKarle Plumber OF PROCEDURE:  12/17/2011  PREOPERATIVE DIAGNOSIS: Pneumonia requiring antibiotics greater than five days.   PROCEDURE PERFORMED: Insertion of left basilic single-lumen PICC line.   PROCEDURE PERFORMED BY: Renford DillsGregory G. Juliya Magill, MD    PROCEDURE: The patient is in the Intensive Care Unit. He is ventilated and intubated on multiple medications and, therefore, the PICC line is being performed at the bedside. Left arm is extended. Ultrasound is placed in a sterile sleeve. Ultrasound is utilized secondary to lack of appropriate landmarks and to avoid vascular injury. Under ultrasound visualization, the basilic vein is identified. It is echolucent, homogeneous, and easily compressible indicating patency. Image is recorded. 1% lidocaine is infiltrated into the soft tissues and access to the basilic vein is obtained with real-time ultrasound guidance using a micropuncture needle. Microwire is then advanced. PICC line is approximated on the chest, trimmed to 46 cm, and then advanced over the wire through the peel-away sheath. Peel-away sheath and wire are removed. PICC line aspirates and flushes well and is secured to the skin of the arm with a StatLock and a sterile dressing. Follow-up chest x-ray demonstrates the tip of the PICC line is in the left innominate vein very well positioned for central venous access and is adequate to use for all therapies.   SUMMARY: Successful placement of a left arm PICC line in a critically ill patient.   ____________________________ Renford DillsGregory G. Hooper Petteway, MD ggs:drc D: 12/17/2011 16:37:54 ET T: 12/17/2011 17:11:13 ET JOB#: [id number]  cc: Renford DillsGregory G. Stefana Lodico, MD, <Dictator> Renford DillsGREGORY G Olanda Boughner MD ELECTRONICALLY SIGNED 12/25/2011 9:01

## 2015-01-15 NOTE — Consult Note (Signed)
Called for bleeding from trach site. No active flow of blood seen after suctioning and monitoring for 10 minutes. Packed wound with Surgicel and gauze. Will make Dr. Jenne CampusMcQueen aware so he can follow up.  Electronic Signatures: Sandi MealyBennett, Darius Fillingim S (MD)  (Signed on 20-Mar-13 18:35)  Authored  Last Updated: 20-Mar-13 18:35 by Sandi MealyBennett, Arkeem Harts S (MD)

## 2015-01-15 NOTE — Op Note (Signed)
PATIENT NAME:  Jeremy Vance, [name] MR#:  [id number] DATE OF BIRTH:  07/21/1941  DATE OF PROCEDURE:  12/10/2011  PREOPERATIVE DIAGNOSIS: Failure to extubate.   POSTOPERATIVE DIAGNOSIS: Failure to extubate.   OPERATION PERFORMED: Tracheostomy.   SURGEON: Davina Pokehapman T. Sakari Raisanen, MD  ASSISTANT SURGEON: Dr. Karlyne GreenspanWilliam Vaught  OPERATIVE FINDINGS: Normal neck anatomy anteriorly.   DESCRIPTION OF PROCEDURE: Dorene SorrowJerry was identified in the holding area, taken to the Operating Room, placed in supine position. The patient was started intubated and sedated. The neck was gently extended. The anterior neck was prepped and draped sterilely. Incision line was marked just over the cricoid cartilage. Incision was made to and down through the platysma. Hemostasis achieved using the Bovie cautery. Strap muscle was divided in the midline. There were multiple large veins which were divided between hemostats and suture ligated using 2-0 silk suture. Dissection proceeded down to the cricoid cartilage. The Bovie was used to dissect the anterior fascia off the cricoid cartilage. Using the mosquito the pretracheal fascia was identified. The isthmus of the thyroid was divided between the Harmonic scalpel. There were also small bleeding vessels which were clamped and suture ligated using 3-0 silk sutures. A cricoid hook was then placed. The trachea was pulled superiorly. The anterior tracheal wall was cleaned. An incision was then made between the second and third tracheal ring. An inverted U-shaped Bjork flap was then created in the anterior tracheal wall. A 3-0 Vicryl was then affixed to this anterior tracheal flap up to the inferior skin incision. The endotracheal tube was advanced out slowly. A #8.0 cuffed Shiley tracheostomy tube was placed within the tracheotomy without difficulty. The tube was then sutured in position in all four quadrants using 2-0 silk. A soft trach tie was also placed for security. A standard tracheal sponge was placed  beneath the trach and impregnated with Betadine. There was no bleeding at this point. Patient was returned to anesthesia where he was taken back to the Intensive Care Unit in stable condition.   CULTURES: None.   SPECIMENS: None.   ESTIMATED BLOOD LOSS: Less than 20 mL.  ____________________________ Davina Pokehapman T. Eutimio Gharibian, MD ctm:cms D: 12/10/2011 12:59:07 ET T: 12/10/2011 13:13:54 ET JOB#: [id number]  cc: Davina Pokehapman T. Nikole Swartzentruber, MD, <Dictator> Davina PokeHAPMAN T Lashae Wollenberg MD ELECTRONICALLY SIGNED 12/31/2011 7:22

## 2015-01-15 NOTE — Consult Note (Signed)
Chief Complaint:   Subjective/Chief Complaint Covering for Dr. Vira Agar. G site intact. Having BM's.   VITAL SIGNS/ANCILLARY NOTES: **Vital Signs.:   23-Mar-13 07:15   Vital Signs Type Routine   Temperature Temperature (F) 99   Celsius 37.2   Temperature Source tympanic   Pulse Pulse 54   Respirations Respirations 24   Systolic BP Systolic BP 93   Diastolic BP (mmHg) Diastolic BP (mmHg) 51   Mean BP 65   Pulse Ox % Pulse Ox % 96   Oxygen Delivery Ventilator Assisted   Pulse Ox Heart Rate 54   Nurse Fingerstick (mg/dL) FSBS (fasting range 65-99 mg/dL) 129   Comments/Interventions  Nurse Notified   Brief Assessment:   Cardiac Regular    Respiratory clear BS  on vent    Gastrointestinal G site intact. Non tender   Routine Hem:  23-Mar-13 04:21    WBC (CBC) 10.2   RBC (CBC) 3.66   Hemoglobin (CBC) 11.3   Hematocrit (CBC) 34.3   Platelet Count (CBC) 117   MCV 94   MCH 30.8   MCHC 32.9   RDW 14.3  Routine Chem:  23-Mar-13 04:21    Glucose, Serum 148   BUN 27   Creatinine (comp) 0.61   Sodium, Serum 137   Potassium, Serum 3.8   Chloride, Serum 95   CO2, Serum 32   Calcium (Total), Serum 7.6   Osmolality (calc) 282   eGFR (African American) >60   eGFR (Non-African American) >60   Anion Gap 10  Routine Hem:  23-Mar-13 04:21    Neutrophil % 91.9   Lymphocyte % 4.2   Monocyte % 3.2   Eosinophil % 0.5   Basophil % 0.2   Neutrophil # 9.4   Lymphocyte # 0.4   Monocyte # 0.3   Eosinophil # 0.0   Basophil # 0.0   Assessment/Plan:  Assessment/Plan:   Assessment S/P G tube    Plan Ok to start tube feedings and meds via G tube. Start slowly and advance as tolerated. will sign off. Call back if there are issues with feeding. Thanks.   Electronic Signatures: Verdie Shire (MD)  (Signed 23-Mar-13 09:29)  Authored: Chief Complaint, VITAL SIGNS/ANCILLARY NOTES, Brief Assessment, Lab Results, Assessment/Plan   Last Updated: 23-Mar-13 09:29 by Verdie Shire (MD)

## 2015-01-15 NOTE — Consult Note (Signed)
PATIENT NAME:  Jeremy Vance, Jeremy Vance MR#:  [id number] DATE OF BIRTH:  [birth year]  DATE OF CONSULTATION:  12/06/2011  REFERRING PHYSICIAN:  Dr. Belia HemanKasa  CONSULTING PHYSICIAN:  Davina Pokehapman T. Jhovanny Guinta, MD  REASON FOR CONSULTATION: Possible tracheostomy.   HISTORY OF PRESENT ILLNESS: This is a 74 year old gentleman was history of COPD, diabetes, hypertension, and questionable arrhythmia who was admitted on 11/25/2011 for exacerbation of same and was intubated. Since that time he has remained intubated. Multiple attempts at trying to wean him off the respirator were unsuccessful. We were called for possible tracheostomy tube placement.   PAST MEDICAL HISTORY:  1. Chronic obstructive pulmonary disease. 2. Congestive heart failure. 3. Diabetes. 4. Hypertension. 5. Hernia repair. 6. Questionable irregular heartbeat   MEDICATIONS: Multiple, noted and listed in the chart.   ALLERGIES: He has no allergies listed on the chart.   FAMILY HISTORY: Unobtainable.   PHYSICAL EXAMINATION:   GENERAL: The patient is intubated relatively sedated. He is moderately obese.   NECK: The anterior neck appears relatively normal.  HEENT: External ears are benign. Anterior nose is benign.   ORAL CAVITY AND OROPHARYNX: Not examined due to multiple tubes.   IMPRESSION: Failure to extubate.  PLAN: We will try to plan for a tracheostomy this following Tuesday. Will have ICU staff consent for tracheostomy tube placement.   ____________________________ Davina Pokehapman T. Mustapha Colson, MD [id number] D: 12/06/2011 15:39:18 ET T: 12/06/2011 16:01:23 ET JOB#: [id number]  cc: Davina Pokehapman T. Sriansh Farra, MD, <Dictator> Davina PokeHAPMAN T Decklyn Hornik MD ELECTRONICALLY SIGNED 12/31/2011 7:21
